# Patient Record
Sex: Male | Born: 1970 | Race: White | Hispanic: No | Marital: Married | Smoking: Former smoker
Health system: Southern US, Community
[De-identification: ages and names within clinical notes are randomized; demographics above are authoritative.]

## PROBLEM LIST (undated history)

## (undated) DIAGNOSIS — T7840XA Allergy, unspecified, initial encounter: Secondary | ICD-10-CM

## (undated) HISTORY — PX: NASAL SEPTUM SURGERY: SHX37

## (undated) HISTORY — PX: WISDOM TOOTH EXTRACTION: SHX21

## (undated) HISTORY — DX: Allergy, unspecified, initial encounter: T78.40XA

---

## 2001-01-22 ENCOUNTER — Emergency Department (HOSPITAL_COMMUNITY): Admission: EM | Admit: 2001-01-22 | Discharge: 2001-01-22 | Payer: Self-pay | Admitting: Emergency Medicine

## 2001-02-01 ENCOUNTER — Encounter: Admission: RE | Admit: 2001-02-01 | Discharge: 2001-02-01 | Payer: Self-pay | Admitting: Family Medicine

## 2001-02-01 ENCOUNTER — Encounter: Payer: Self-pay | Admitting: Family Medicine

## 2017-01-03 ENCOUNTER — Ambulatory Visit (INDEPENDENT_AMBULATORY_CARE_PROVIDER_SITE_OTHER): Payer: 59 | Admitting: Family Medicine

## 2017-01-03 ENCOUNTER — Encounter: Payer: Self-pay | Admitting: Family Medicine

## 2017-01-03 ENCOUNTER — Ambulatory Visit (INDEPENDENT_AMBULATORY_CARE_PROVIDER_SITE_OTHER): Payer: 59

## 2017-01-03 VITALS — BP 136/96 | HR 101 | Temp 98.1°F | Ht 72.0 in | Wt 213.0 lb

## 2017-01-03 DIAGNOSIS — M79641 Pain in right hand: Secondary | ICD-10-CM

## 2017-01-03 DIAGNOSIS — Z0000 Encounter for general adult medical examination without abnormal findings: Secondary | ICD-10-CM

## 2017-01-03 DIAGNOSIS — W57XXXS Bitten or stung by nonvenomous insect and other nonvenomous arthropods, sequela: Secondary | ICD-10-CM | POA: Diagnosis not present

## 2017-01-03 LAB — URINALYSIS
Bilirubin, UA: NEGATIVE
Glucose, UA: NEGATIVE
Leukocytes, UA: NEGATIVE
Nitrite, UA: NEGATIVE
Protein, UA: NEGATIVE
Specific Gravity, UA: 1.025 (ref 1.005–1.030)
Urobilinogen, Ur: 0.2 mg/dL (ref 0.2–1.0)
pH, UA: 5.5 (ref 5.0–7.5)

## 2017-01-03 NOTE — Patient Instructions (Addendum)
DASH Eating Plan DASH stands for "Dietary Approaches to Stop Hypertension." The DASH eating plan is a healthy eating plan that has been shown to reduce high blood pressure (hypertension). It may also reduce your risk for type 2 diabetes, heart disease, and stroke. The DASH eating plan may also help with weight loss. What are tips for following this plan? General guidelines  Avoid eating more than 2,300 mg (milligrams) of salt (sodium) a day. If you have hypertension, you may need to reduce your sodium intake to 1,500 mg a day.  Limit alcohol intake to no more than 1 drink a day for nonpregnant women and 2 drinks a day for men. One drink equals 12 oz of beer, 5 oz of wine, or 1 oz of hard liquor.  Work with your health care provider to maintain a healthy body weight or to lose weight. Ask what an ideal weight is for you.  Get at least 30 minutes of exercise that causes your heart to beat faster (aerobic exercise) most days of the week. Activities may include walking, swimming, or biking.  Work with your health care provider or diet and nutrition specialist (dietitian) to adjust your eating plan to your individual calorie needs. Reading food labels  Check food labels for the amount of sodium per serving. Choose foods with less than 5 percent of the Daily Value of sodium. Generally, foods with less than 300 mg of sodium per serving fit into this eating plan.  To find whole grains, look for the word "whole" as the first word in the ingredient list. Shopping  Buy products labeled as "low-sodium" or "no salt added."  Buy fresh foods. Avoid canned foods and premade or frozen meals. Cooking  Avoid adding salt when cooking. Use salt-free seasonings or herbs instead of table salt or sea salt. Check with your health care provider or pharmacist before using salt substitutes.  Do not fry foods. Cook foods using healthy methods such as baking, boiling, grilling, and broiling instead.  Cook with  heart-healthy oils, such as olive, canola, soybean, or sunflower oil. Meal planning   Eat a balanced diet that includes: ? 5 or more servings of fruits and vegetables each day. At each meal, try to fill half of your plate with fruits and vegetables. ? Up to 6-8 servings of whole grains each day. ? Less than 6 oz of lean meat, poultry, or fish each day. A 3-oz serving of meat is about the same size as a deck of cards. One egg equals 1 oz. ? 2 servings of low-fat dairy each day. ? A serving of nuts, seeds, or beans 5 times each week. ? Heart-healthy fats. Healthy fats called Omega-3 fatty acids are found in foods such as flaxseeds and coldwater fish, like sardines, salmon, and mackerel.  Limit how much you eat of the following: ? Canned or prepackaged foods. ? Food that is high in trans fat, such as fried foods. ? Food that is high in saturated fat, such as fatty meat. ? Sweets, desserts, sugary drinks, and other foods with added sugar. ? Full-fat dairy products.  Do not salt foods before eating.  Try to eat at least 2 vegetarian meals each week.  Eat more home-cooked food and less restaurant, buffet, and fast food.  When eating at a restaurant, ask that your food be prepared with less salt or no salt, if possible. What foods are recommended? The items listed may not be a complete list. Talk with your dietitian about what   dietary choices are best for you. Grains Whole-grain or whole-wheat bread. Whole-grain or whole-wheat pasta. Brown rice. Oatmeal. Quinoa. Bulgur. Whole-grain and low-sodium cereals. Pita bread. Low-fat, low-sodium crackers. Whole-wheat flour tortillas. Vegetables Fresh or frozen vegetables (raw, steamed, roasted, or grilled). Low-sodium or reduced-sodium tomato and vegetable juice. Low-sodium or reduced-sodium tomato sauce and tomato paste. Low-sodium or reduced-sodium canned vegetables. Fruits All fresh, dried, or frozen fruit. Canned fruit in natural juice (without  added sugar). Meat and other protein foods Skinless chicken or turkey. Ground chicken or turkey. Pork with fat trimmed off. Fish and seafood. Egg whites. Dried beans, peas, or lentils. Unsalted nuts, nut butters, and seeds. Unsalted canned beans. Lean cuts of beef with fat trimmed off. Low-sodium, lean deli meat. Dairy Low-fat (1%) or fat-free (skim) milk. Fat-free, low-fat, or reduced-fat cheeses. Nonfat, low-sodium ricotta or cottage cheese. Low-fat or nonfat yogurt. Low-fat, low-sodium cheese. Fats and oils Soft margarine without trans fats. Vegetable oil. Low-fat, reduced-fat, or light mayonnaise and salad dressings (reduced-sodium). Canola, safflower, olive, soybean, and sunflower oils. Avocado. Seasoning and other foods Herbs. Spices. Seasoning mixes without salt. Unsalted popcorn and pretzels. Fat-free sweets. What foods are not recommended? The items listed may not be a complete list. Talk with your dietitian about what dietary choices are best for you. Grains Baked goods made with fat, such as croissants, muffins, or some breads. Dry pasta or rice meal packs. Vegetables Creamed or fried vegetables. Vegetables in a cheese sauce. Regular canned vegetables (not low-sodium or reduced-sodium). Regular canned tomato sauce and paste (not low-sodium or reduced-sodium). Regular tomato and vegetable juice (not low-sodium or reduced-sodium). Pickles. Olives. Fruits Canned fruit in a light or heavy syrup. Fried fruit. Fruit in cream or butter sauce. Meat and other protein foods Fatty cuts of meat. Ribs. Fried meat. Bacon. Sausage. Bologna and other processed lunch meats. Salami. Fatback. Hotdogs. Bratwurst. Salted nuts and seeds. Canned beans with added salt. Canned or smoked fish. Whole eggs or egg yolks. Chicken or turkey with skin. Dairy Whole or 2% milk, cream, and half-and-half. Whole or full-fat cream cheese. Whole-fat or sweetened yogurt. Full-fat cheese. Nondairy creamers. Whipped toppings.  Processed cheese and cheese spreads. Fats and oils Butter. Stick margarine. Lard. Shortening. Ghee. Bacon fat. Tropical oils, such as coconut, palm kernel, or palm oil. Seasoning and other foods Salted popcorn and pretzels. Onion salt, garlic salt, seasoned salt, table salt, and sea salt. Worcestershire sauce. Tartar sauce. Barbecue sauce. Teriyaki sauce. Soy sauce, including reduced-sodium. Steak sauce. Canned and packaged gravies. Fish sauce. Oyster sauce. Cocktail sauce. Horseradish that you find on the shelf. Ketchup. Mustard. Meat flavorings and tenderizers. Bouillon cubes. Hot sauce and Tabasco sauce. Premade or packaged marinades. Premade or packaged taco seasonings. Relishes. Regular salad dressings. Where to find more information:  National Heart, Lung, and Blood Institute: www.nhlbi.nih.gov  American Heart Association: www.heart.org Summary  The DASH eating plan is a healthy eating plan that has been shown to reduce high blood pressure (hypertension). It may also reduce your risk for type 2 diabetes, heart disease, and stroke.  With the DASH eating plan, you should limit salt (sodium) intake to 2,300 mg a day. If you have hypertension, you may need to reduce your sodium intake to 1,500 mg a day.  When on the DASH eating plan, aim to eat more fresh fruits and vegetables, whole grains, lean proteins, low-fat dairy, and heart-healthy fats.  Work with your health care provider or diet and nutrition specialist (dietitian) to adjust your eating plan to your individual   calorie needs. This information is not intended to replace advice given to you by your health care provider. Make sure you discuss any questions you have with your health care provider. Document Released: 09/14/2011 Document Revised: 09/18/2016 Document Reviewed: 09/18/2016 Elsevier Interactive Patient Education  2017 Elsevier Inc.  

## 2017-01-03 NOTE — Progress Notes (Addendum)
Subjective:  Patient ID: Joseph Spears, male    DOB: 09/04/1971  Age: 46 y.o. MRN: [id number]  CC: New Patient (Initial Visit) (pt here today to establish care and is c/o right hand swelling and pain. He hasn't had any recent injuries.)   HPI Joseph Spears presents for 3 days of increasing swelling and soreness in the right hand. He has no known injury. No overuse no suspicious event. He's not been bitten this far as he knows. No sharp momentary pains noted. It just seemed to develop through the day 3 days ago and increase slowly since that time. Of note is that he had a tick bite last fall. She has not seen this resolved just yet and that there still is a small crust that's irritated at the lower left abdomen near the waist band. He describes it as a small deer tick.  Patient has overall excellent health regimen. He eats carefully and works out regularly. However he's been off of his plan for about 6 months and is put on about 20 pounds. 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   History Joseph Spears has a past medical history of Allergy.   He has a past surgical history that includes Wisdom tooth extraction and Nasal septum surgery.   His family history includes Arthritis in his mother; Diabetes in his father; Hyperlipidemia in his mother; Hypertension in his mother.He reports that he quit smoking about 19 years ago. His smoking use included Cigarettes. He smoked 1.50 packs per day. He has never used smokeless tobacco. He reports that he does not drink alcohol or use drugs.    ROS Review of Systems  Constitutional: Negative for chills, diaphoresis, fever and unexpected weight change.  HENT: Negative for congestion, hearing loss, rhinorrhea and sore throat.   Eyes: Negative for visual disturbance.  Respiratory: Negative for cough and shortness of breath.   Cardiovascular: Negative for chest pain.  Gastrointestinal: Negative for abdominal pain,  constipation and diarrhea.  Genitourinary: Negative for dysuria and flank pain.  Musculoskeletal: Positive for joint swelling. Negative for arthralgias.  Skin: Negative for rash.  Neurological: Negative for dizziness and headaches.  Psychiatric/Behavioral: Negative for dysphoric mood and sleep disturbance.    Objective:  BP (!) 136/96   Pulse (!) 101   Temp 98.1 F (36.7 C) (Oral)   Ht 6' (1.829 m)   Wt 213 lb (96.6 kg)   BMI 28.89 kg/m   BP Readings from Last 3 Encounters:  01/03/17 (!) 136/96    Wt Readings from Last 3 Encounters:  01/03/17 213 lb (96.6 kg)     Physical Exam  Constitutional: He is oriented to person, place, and time. He appears well-developed and well-nourished. No distress.  HENT:  Head: Normocephalic and atraumatic.  Right Ear: External ear normal.  Left Ear: External ear normal.  Nose: Nose normal.  Mouth/Throat: Oropharynx is clear and moist.  Eyes: Conjunctivae and EOM are normal. Pupils are equal, round, and reactive to light.  Neck: Normal range of motion. Neck supple. No tracheal deviation present. No thyromegaly present.  Cardiovascular: Normal rate, regular rhythm and normal heart sounds.  Exam reveals no gallop and no friction rub.   No murmur heard. Pulmonary/Chest: Effort normal and breath sounds normal. No respiratory distress. He has no wheezes. He has no rales.  Abdominal: Soft. Bowel sounds are normal. He exhibits no distension and no mass. There is no tenderness.  Musculoskeletal: Normal range  of motion. He exhibits edema (right hand).  Lymphadenopathy:    He has no cervical adenopathy.  Neurological: He is alert and oriented to person, place, and time. He has normal reflexes.  Skin: Skin is warm and dry.  Psychiatric: He has a normal mood and affect. His behavior is normal. Judgment and thought content normal.      Assessment & Plan:   Joseph Spears was seen today for new patient (initial visit).  Diagnoses and all orders for this  visit:  Well adult exam -     CBC with Differential/Platelet -     CMP14+EGFR -     Lipid panel -     PSA Total (Reflex To Free)  Hand pain, right -     CBC with Differential/Platelet -     CMP14+EGFR -     PSA Total (Reflex To Free) -     Urinalysis -     DG Hand Complete Right; Future -     Lyme Ab/Western Blot Reflex -     Sedimentation rate -     Uric acid  Tick bite, sequela -     CBC with Differential/Platelet -     CMP14+EGFR -     Lyme Ab/Western Blot Reflex       I am having Joseph Spears maintain his Omega-3 Fatty Acids (FISH OIL CONCENTRATE PO) and loratadine.  Allergies as of 01/03/2017   No Known Allergies     Medication List       Accurate as of 01/03/17  3:12 PM. Always use your most recent med list.          FISH OIL CONCENTRATE PO Take by mouth.   loratadine 10 MG tablet Commonly known as:  CLARITIN Take 10 mg by mouth daily.     Alternate ice and heat. Ice primarily after strenuous activity with the hand. Notify me if he takes a turn for the worse in the form of swelling redness pain or pus. No brace should be necessary at this point but moderate activities. Ibuprofen 600 mg 3 times a day OTC should be sufficient for discomfort.  Marginally elevated blood pressure noted today. He states that his weight is up and he's off track with his workout and diet regimen. He is going to resume that. He'll monitor his blood pressure at work and home and notify me if it doesn't come down to 85 or below diastolic.  Follow-up: Return in about 1 year (around 01/03/2018), or if symptoms worsen or fail to improve, for Wellness.  Claretta Fraise, M.D.

## 2017-01-04 LAB — CMP14+EGFR
ALT: 41 IU/L (ref 0–44)
AST: 24 IU/L (ref 0–40)
Albumin/Globulin Ratio: 1.9 (ref 1.2–2.2)
Albumin: 5 g/dL (ref 3.5–5.5)
Alkaline Phosphatase: 71 IU/L (ref 39–117)
BUN/Creatinine Ratio: 20 (ref 9–20)
BUN: 17 mg/dL (ref 6–24)
Bilirubin Total: 0.6 mg/dL (ref 0.0–1.2)
CO2: 25 mmol/L (ref 18–29)
Calcium: 9.8 mg/dL (ref 8.7–10.2)
Chloride: 97 mmol/L (ref 96–106)
Creatinine, Ser: 0.86 mg/dL (ref 0.76–1.27)
GFR calc Af Amer: 119 mL/min/{1.73_m2} (ref >59)
GFR calc non Af Amer: 103 mL/min/{1.73_m2} (ref >59)
Globulin, Total: 2.7 g/dL (ref 1.5–4.5)
Glucose: 83 mg/dL (ref 65–99)
Potassium: 4.1 mmol/L (ref 3.5–5.2)
Sodium: 139 mmol/L (ref 134–144)
Total Protein: 7.7 g/dL (ref 6.0–8.5)

## 2017-01-04 LAB — CBC WITH DIFFERENTIAL/PLATELET
Basophils Absolute: 0 10*3/uL (ref 0.0–0.2)
Basos: 0 %
EOS (ABSOLUTE): 0.1 10*3/uL (ref 0.0–0.4)
Eos: 2 %
Hematocrit: 49.7 % (ref 37.5–51.0)
Hemoglobin: 17 g/dL (ref 13.0–17.7)
Immature Grans (Abs): 0 10*3/uL (ref 0.0–0.1)
Immature Granulocytes: 0 %
Lymphocytes Absolute: 2 10*3/uL (ref 0.7–3.1)
Lymphs: 29 %
MCH: 30.6 pg (ref 26.6–33.0)
MCHC: 34.2 g/dL (ref 31.5–35.7)
MCV: 90 fL (ref 79–97)
Monocytes Absolute: 0.5 10*3/uL (ref 0.1–0.9)
Monocytes: 8 %
Neutrophils Absolute: 4.2 10*3/uL (ref 1.4–7.0)
Neutrophils: 61 %
Platelets: 219 10*3/uL (ref 150–379)
RBC: 5.55 x10E6/uL (ref 4.14–5.80)
RDW: 13.4 % (ref 12.3–15.4)
WBC: 6.9 10*3/uL (ref 3.4–10.8)

## 2017-01-04 LAB — PSA TOTAL (REFLEX TO FREE): Prostate Specific Ag, Serum: 1 ng/mL (ref 0.0–4.0)

## 2017-01-04 LAB — SEDIMENTATION RATE: Sed Rate: 2 mm/hr (ref 0–15)

## 2017-01-04 LAB — LYME AB/WESTERN BLOT REFLEX
LYME DISEASE AB, QUANT, IGM: <0.8 index (ref 0.00–0.79)
Lyme IgG/IgM Ab: <0.91 {ISR} (ref 0.00–0.90)

## 2017-01-04 LAB — LIPID PANEL
Chol/HDL Ratio: 6.1 ratio units — ABNORMAL HIGH (ref 0.0–5.0)
Cholesterol, Total: 237 mg/dL — ABNORMAL HIGH (ref 100–199)
HDL: 39 mg/dL — ABNORMAL LOW (ref >39)
Triglycerides: 472 mg/dL — ABNORMAL HIGH (ref 0–149)

## 2017-01-04 LAB — URIC ACID: Uric Acid: 6.4 mg/dL (ref 3.7–8.6)

## 2017-01-08 ENCOUNTER — Other Ambulatory Visit: Payer: Self-pay | Admitting: Family Medicine

## 2017-01-08 ENCOUNTER — Telehealth: Payer: Self-pay | Admitting: Family Medicine

## 2017-01-08 ENCOUNTER — Other Ambulatory Visit: Payer: Self-pay | Admitting: *Deleted

## 2017-01-08 MED ORDER — PREDNISONE 10 MG PO TABS: ORAL_TABLET | ORAL | 0 refills | Status: DC

## 2017-01-08 MED ORDER — ATORVASTATIN CALCIUM 40 MG PO TABS: 40.0000 mg | ORAL_TABLET | Freq: Every day | ORAL | 1 refills | Status: DC

## 2017-01-08 NOTE — Telephone Encounter (Signed)
Pt aware of lab results States his hand is still swollen after 7 days now Has been icing it as much as possible and taking tylenol as directed. Please advise

## 2017-01-08 NOTE — Telephone Encounter (Signed)
Please contact the patient to tell him that the next step in treatment for this is a prednisone Dosepak. I sent this prescription into [facility].  Thanks, [employer]. (I tried to reach him three times this afternoon with no success.)

## 2017-01-09 ENCOUNTER — Telehealth: Payer: Self-pay | Admitting: Family Medicine

## 2017-01-09 NOTE — Telephone Encounter (Signed)
Patient aware of recommendations.  

## 2017-01-09 NOTE — Telephone Encounter (Signed)
Patient aware that labs have been released to mychart.

## 2017-02-13 MED FILL — ATORVASTATIN 40 MG TABLET: 40 | 90 days supply | Qty: 90 | Fill #0

## 2017-05-28 MED FILL — ATORVASTATIN 40 MG TABLET: 40 | 60 days supply | Qty: 60 | Fill #1

## 2017-08-03 ENCOUNTER — Ambulatory Visit (INDEPENDENT_AMBULATORY_CARE_PROVIDER_SITE_OTHER): Payer: 59 | Admitting: Family Medicine

## 2017-08-03 ENCOUNTER — Encounter: Payer: Self-pay | Admitting: Family Medicine

## 2017-08-03 VITALS — BP 134/87 | HR 86 | Temp 98.6°F | Ht 72.0 in | Wt 221.0 lb

## 2017-08-03 DIAGNOSIS — E782 Mixed hyperlipidemia: Secondary | ICD-10-CM

## 2017-08-03 MED ORDER — ATORVASTATIN CALCIUM 40 MG PO TABS: 40.0000 mg | ORAL_TABLET | Freq: Every day | ORAL | 1 refills | Status: DC

## 2017-08-03 MED FILL — ATORVASTATIN 40 MG TABLET: 40 | 90 days supply | Qty: 90 | Fill #0

## 2017-08-03 NOTE — Patient Instructions (Signed)
Fat and Cholesterol Restricted Diet Getting too much fat and cholesterol in your diet may cause health problems. Following this diet helps keep your fat and cholesterol at normal levels. This can keep you from getting sick. What types of fat should I choose?  Choose monosaturated and polyunsaturated fats. These are found in foods such as olive oil, canola oil, flaxseeds, walnuts, almonds, and seeds.  Eat more omega-3 fats. Good choices include salmon, mackerel, sardines, tuna, flaxseed oil, and ground flaxseeds.  Limit saturated fats. These are in animal products such as meats, butter, and cream. They can also be in plant products such as palm oil, palm kernel oil, and coconut oil.  Avoid foods with partially hydrogenated oils in them. These contain trans fats. Examples of foods that have trans fats are stick margarine, some tub margarines, cookies, crackers, and other baked goods. What general guidelines do I need to follow?  Check food labels. Look for the words "trans fat" and "saturated fat."  When preparing a meal: ? Fill half of your plate with vegetables and green salads. ? Fill one fourth of your plate with whole grains. Look for the word "whole" as the first word in the ingredient list. ? Fill one fourth of your plate with lean protein foods.  Eat more foods that have fiber, like apples, carrots, beans, peas, and barley.  Eat more home-cooked foods. Eat less at restaurants and buffets.  Limit or avoid alcohol.  Limit foods high in starch and sugar.  Limit fried foods.  Cook foods without frying them. Baking, boiling, grilling, and broiling are all great options.  Lose weight if you are overweight. Losing even a small amount of weight can help your overall health. It can also help prevent diseases such as diabetes and heart disease. What foods can I eat? Grains Whole grains, such as whole wheat or whole grain breads, crackers, cereals, and pasta. Unsweetened oatmeal,  bulgur, barley, quinoa, or brown rice. Corn or whole wheat flour tortillas. Vegetables Fresh or frozen vegetables (raw, steamed, roasted, or grilled). Green salads. Fruits All fresh, canned (in natural juice), or frozen fruits. Meat and Other Protein Products Ground beef (85% or leaner), grass-fed beef, or beef trimmed of fat. Skinless chicken or turkey. Ground chicken or turkey. Pork trimmed of fat. All fish and seafood. Eggs. Dried beans, peas, or lentils. Unsalted nuts or seeds. Unsalted canned or dry beans. Dairy Low-fat dairy products, such as skim or 1% milk, 2% or reduced-fat cheeses, low-fat ricotta or cottage cheese, or plain low-fat yogurt. Fats and Oils Tub margarines without trans fats. Light or reduced-fat mayonnaise and salad dressings. Avocado. Olive, canola, sesame, or safflower oils. Natural peanut or almond butter (choose ones without added sugar and oil). The items listed above may not be a complete list of recommended foods or beverages. Contact your dietitian for more options. What foods are not recommended? Grains White bread. White pasta. White rice. Cornbread. Bagels, pastries, and croissants. Crackers that contain trans fat. Vegetables White potatoes. Corn. Creamed or fried vegetables. Vegetables in a cheese sauce. Fruits Dried fruits. Canned fruit in light or heavy syrup. Fruit juice. Meat and Other Protein Products Fatty cuts of meat. Ribs, chicken wings, bacon, sausage, bologna, salami, chitterlings, fatback, hot dogs, bratwurst, and packaged luncheon meats. Liver and organ meats. Dairy Whole or 2% milk, cream, half-and-half, and cream cheese. Whole milk cheeses. Whole-fat or sweetened yogurt. Full-fat cheeses. Nondairy creamers and whipped toppings. Processed cheese, cheese spreads, or cheese curds. Sweets and Desserts Corn   syrup, sugars, honey, and molasses. Candy. Jam and jelly. Syrup. Sweetened cereals. Cookies, pies, cakes, donuts, muffins, and ice  cream. Fats and Oils Butter, stick margarine, lard, shortening, ghee, or bacon fat. Coconut, palm kernel, or palm oils. Beverages Alcohol. Sweetened drinks (such as sodas, lemonade, and fruit drinks or punches). The items listed above may not be a complete list of foods and beverages to avoid. Contact your dietitian for more information. This information is not intended to replace advice given to you by your health care provider. Make sure you discuss any questions you have with your health care provider. Document Released: 03/26/2012 Document Revised: 06/01/2016 Document Reviewed: 12/25/2013 Elsevier Interactive Patient Education  2018 Elsevier Inc.  

## 2017-08-03 NOTE — Progress Notes (Signed)
Subjective:  Patient ID: JP EASTHAM, male    DOB: 1971/03/01  Age: 46 y.o. MRN: [id number]  CC: Hyperlipidemia (pt here today for follow up on his cholesterol, no other concerns voiced.)   HPI ARGENIS KUMARI presents for follow-up of elevated cholesterol. Doing well without complaints on current medication. Denies side effects of statin including myalgia and arthralgia and nausea. Also in today for liver function testing. Currently no chest pain, shortness of breath or other cardiovascular related symptoms noted. History Chee has a past medical history of Allergy.   He has a past surgical history that includes Wisdom tooth extraction and Nasal septum surgery.   His family history includes Arthritis in his mother; Diabetes in his father; Hyperlipidemia in his mother; Hypertension in his mother.He reports that he quit smoking about 19 years ago. His smoking use included Cigarettes. He smoked 1.50 packs per day. He has never used smokeless tobacco. He reports that he does not drink alcohol or use drugs.  Current Outpatient Prescriptions on File Prior to Visit  Medication Sig Dispense Refill  . loratadine (CLARITIN) 10 MG tablet Take 10 mg by mouth daily.    . Omega-3 Fatty Acids (FISH OIL CONCENTRATE PO) Take by mouth.     No current facility-administered medications on file prior to visit.     ROS Review of Systems  Constitutional: Negative for chills, diaphoresis, fever and unexpected weight change.  HENT: Negative for congestion, hearing loss, rhinorrhea and sore throat.   Eyes: Negative for visual disturbance.  Respiratory: Negative for cough and shortness of breath.   Cardiovascular: Negative for chest pain.  Gastrointestinal: Negative for abdominal pain, constipation and diarrhea.  Genitourinary: Negative for dysuria and flank pain.  Musculoskeletal: Negative for arthralgias and joint swelling.  Skin: Negative for rash.  Neurological: Negative for dizziness and headaches.    Psychiatric/Behavioral: Negative for dysphoric mood and sleep disturbance.    Objective:  BP 134/87   Pulse 86   Temp 98.6 F (37 C) (Oral)   Ht 6' (1.829 m)   Wt 221 lb (100.2 kg)   BMI 29.97 kg/m   BP Readings from Last 3 Encounters:  08/03/17 134/87  01/03/17 (!) 136/96    Wt Readings from Last 3 Encounters:  08/03/17 221 lb (100.2 kg)  01/03/17 213 lb (96.6 kg)     Physical Exam  Constitutional: He appears well-developed and well-nourished.  HENT:  Head: Normocephalic and atraumatic.  Right Ear: Tympanic membrane and external ear normal. No decreased hearing is noted.  Left Ear: Tympanic membrane and external ear normal. No decreased hearing is noted.  Mouth/Throat: No oropharyngeal exudate or posterior oropharyngeal erythema.  Eyes: Pupils are equal, round, and reactive to light.  Neck: Normal range of motion. Neck supple.  Cardiovascular: Normal rate and regular rhythm.   No murmur heard. Pulmonary/Chest: Breath sounds normal. No respiratory distress.  Abdominal: Soft. Bowel sounds are normal. He exhibits no mass. There is no tenderness.  Vitals reviewed.      Assessment & Plan:   Kiyoto was seen today for hyperlipidemia.  Diagnoses and all orders for this visit:  Mixed hyperlipidemia -     CBC with Differential/Platelet -     CMP14+EGFR  Other orders -     atorvastatin (LIPITOR) 40 MG tablet; Take 1 tablet (40 mg total) by mouth daily.   I have discontinued Mr. Dehaas predniSONE. I am also having him maintain his Omega-3 Fatty Acids (FISH OIL CONCENTRATE PO), loratadine, and atorvastatin.  Meds ordered this encounter  Medications  . atorvastatin (LIPITOR) 40 MG tablet    Sig: Take 1 tablet (40 mg total) by mouth daily.    Dispense:  90 tablet    Refill:  1   Allergies as of 08/03/2017   No Known Allergies     Medication List       Accurate as of 08/03/17  9:48 AM. Always use your most recent med list.          atorvastatin 40 MG  tablet Commonly known as:  LIPITOR Take 1 tablet (40 mg total) by mouth daily.   FISH OIL CONCENTRATE PO Take by mouth.   loratadine 10 MG tablet Commonly known as:  CLARITIN Take 10 mg by mouth daily.        Follow-up: Return in about 6 months (around 02/01/2018) for Wellness.  Claretta Fraise, M.D.

## 2017-08-04 LAB — CMP14+EGFR
ALBUMIN: 5 g/dL (ref 3.5–5.5)
ALT: 45 IU/L — ABNORMAL HIGH (ref 0–44)
AST: 31 IU/L (ref 0–40)
Albumin/Globulin Ratio: 2.1 (ref 1.2–2.2)
Alkaline Phosphatase: 83 IU/L (ref 39–117)
BILIRUBIN TOTAL: 0.7 mg/dL (ref 0.0–1.2)
BUN / CREAT RATIO: 17 (ref 9–20)
BUN: 16 mg/dL (ref 6–24)
CALCIUM: 9.4 mg/dL (ref 8.7–10.2)
CO2: 21 mmol/L (ref 20–29)
Chloride: 102 mmol/L (ref 96–106)
Creatinine, Ser: 0.93 mg/dL (ref 0.76–1.27)
GFR, EST AFRICAN AMERICAN: 112 mL/min/{1.73_m2} (ref >59)
GFR, EST NON AFRICAN AMERICAN: 97 mL/min/{1.73_m2} (ref >59)
Globulin, Total: 2.4 g/dL (ref 1.5–4.5)
Glucose: 83 mg/dL (ref 65–99)
POTASSIUM: 4.7 mmol/L (ref 3.5–5.2)
Sodium: 141 mmol/L (ref 134–144)
TOTAL PROTEIN: 7.4 g/dL (ref 6.0–8.5)

## 2017-08-04 LAB — CBC WITH DIFFERENTIAL/PLATELET
BASOS: 0 %
Basophils Absolute: 0 10*3/uL (ref 0.0–0.2)
EOS (ABSOLUTE): 0.1 10*3/uL (ref 0.0–0.4)
EOS: 1 %
HEMATOCRIT: 47.4 % (ref 37.5–51.0)
HEMOGLOBIN: 16.5 g/dL (ref 13.0–17.7)
IMMATURE GRANS (ABS): 0 10*3/uL (ref 0.0–0.1)
Immature Granulocytes: 0 %
LYMPHS: 29 %
Lymphocytes Absolute: 1.6 10*3/uL (ref 0.7–3.1)
MCH: 30.7 pg (ref 26.6–33.0)
MCHC: 34.8 g/dL (ref 31.5–35.7)
MCV: 88 fL (ref 79–97)
MONOCYTES: 6 %
Monocytes Absolute: 0.4 10*3/uL (ref 0.1–0.9)
NEUTROS ABS: 3.5 10*3/uL (ref 1.4–7.0)
NEUTROS PCT: 64 %
PLATELETS: 186 10*3/uL (ref 150–379)
RBC: 5.37 x10E6/uL (ref 4.14–5.80)
RDW: 13.6 % (ref 12.3–15.4)
WBC: 5.6 10*3/uL (ref 3.4–10.8)

## 2017-11-01 MED FILL — ATORVASTATIN 40 MG TABLET: 40 | 90 days supply | Qty: 90 | Fill #1

## 2017-11-15 IMAGING — DX DG HAND COMPLETE 3+V*R*
4 series · 4 of 4 positions shown · non-contrast
Comparison: No recent prior.

CLINICAL DATA: Pain and swelling.

EXAM:
RIGHT HAND - COMPLETE 3+ VIEW

[hand pa]
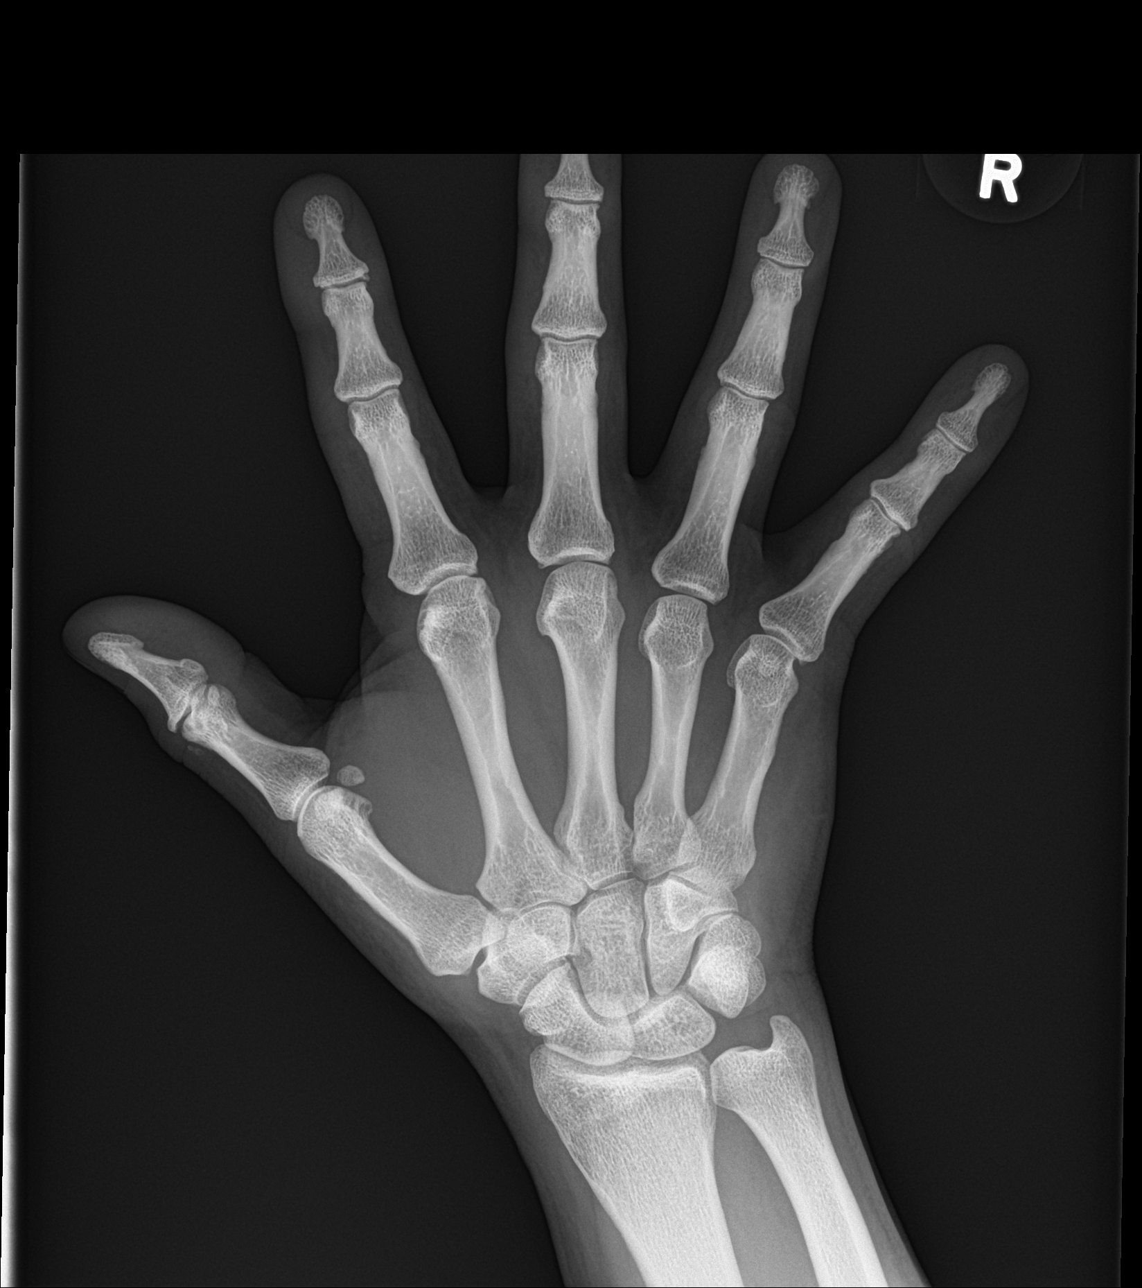

[hand obl]
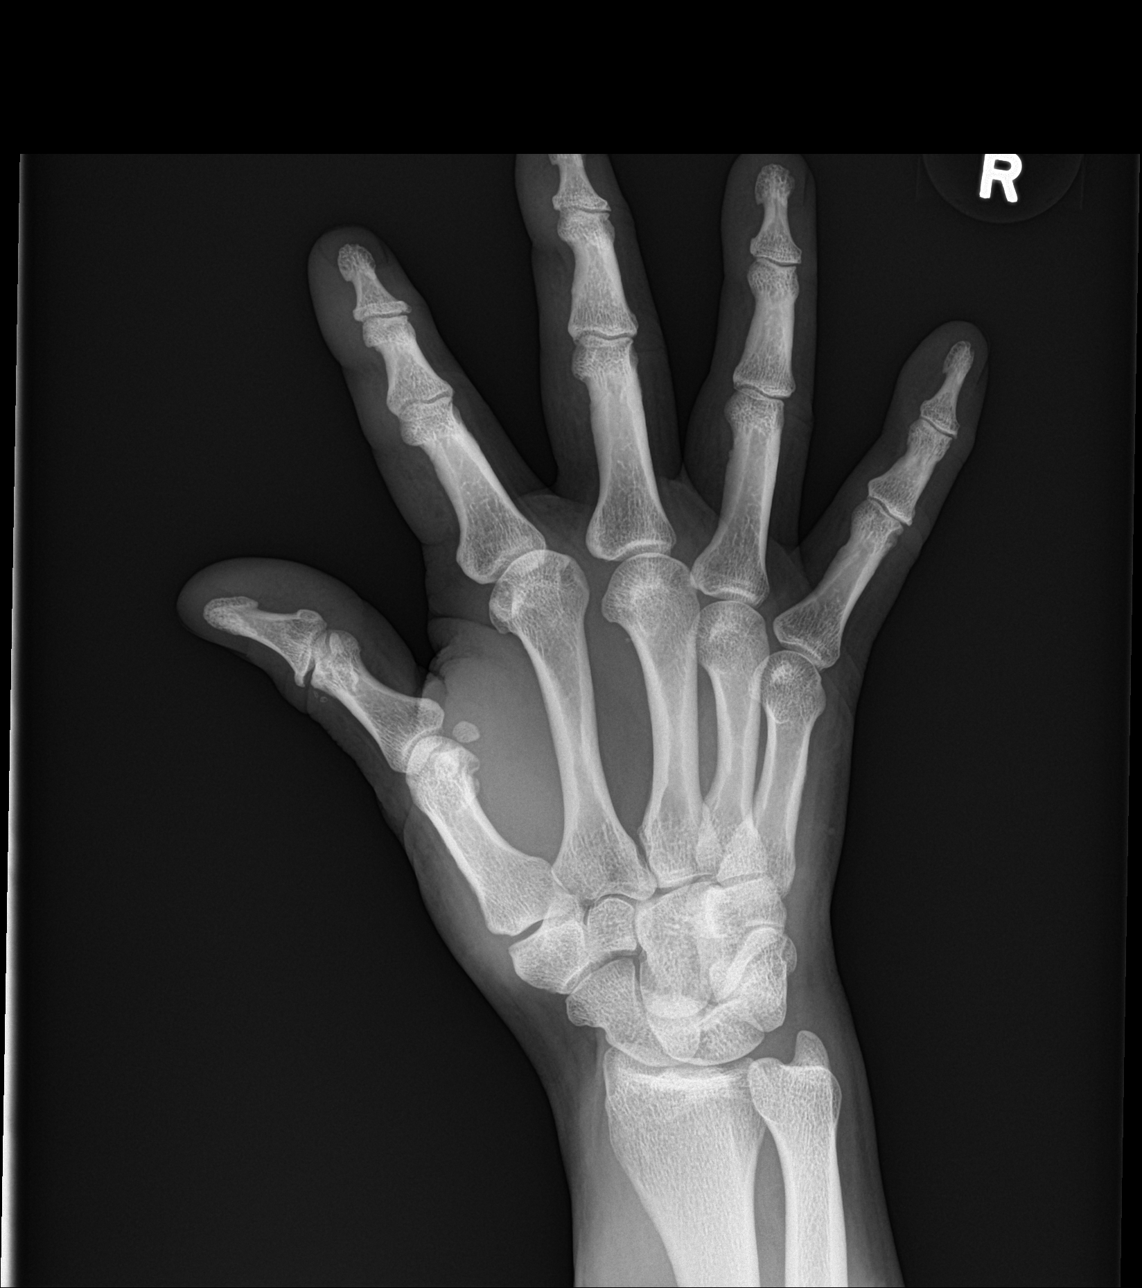

[hand lat (1 of 2)]
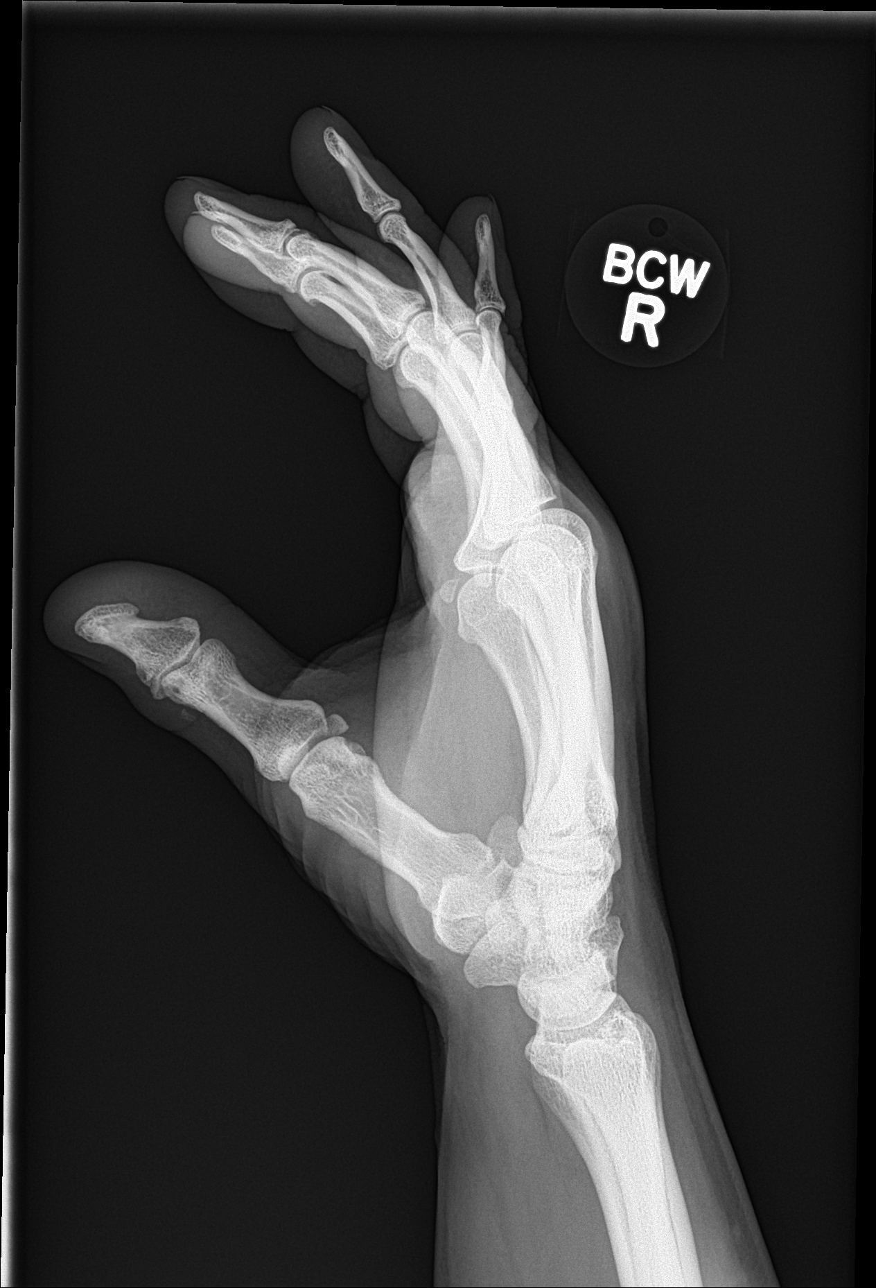

[hand lat (2 of 2)]
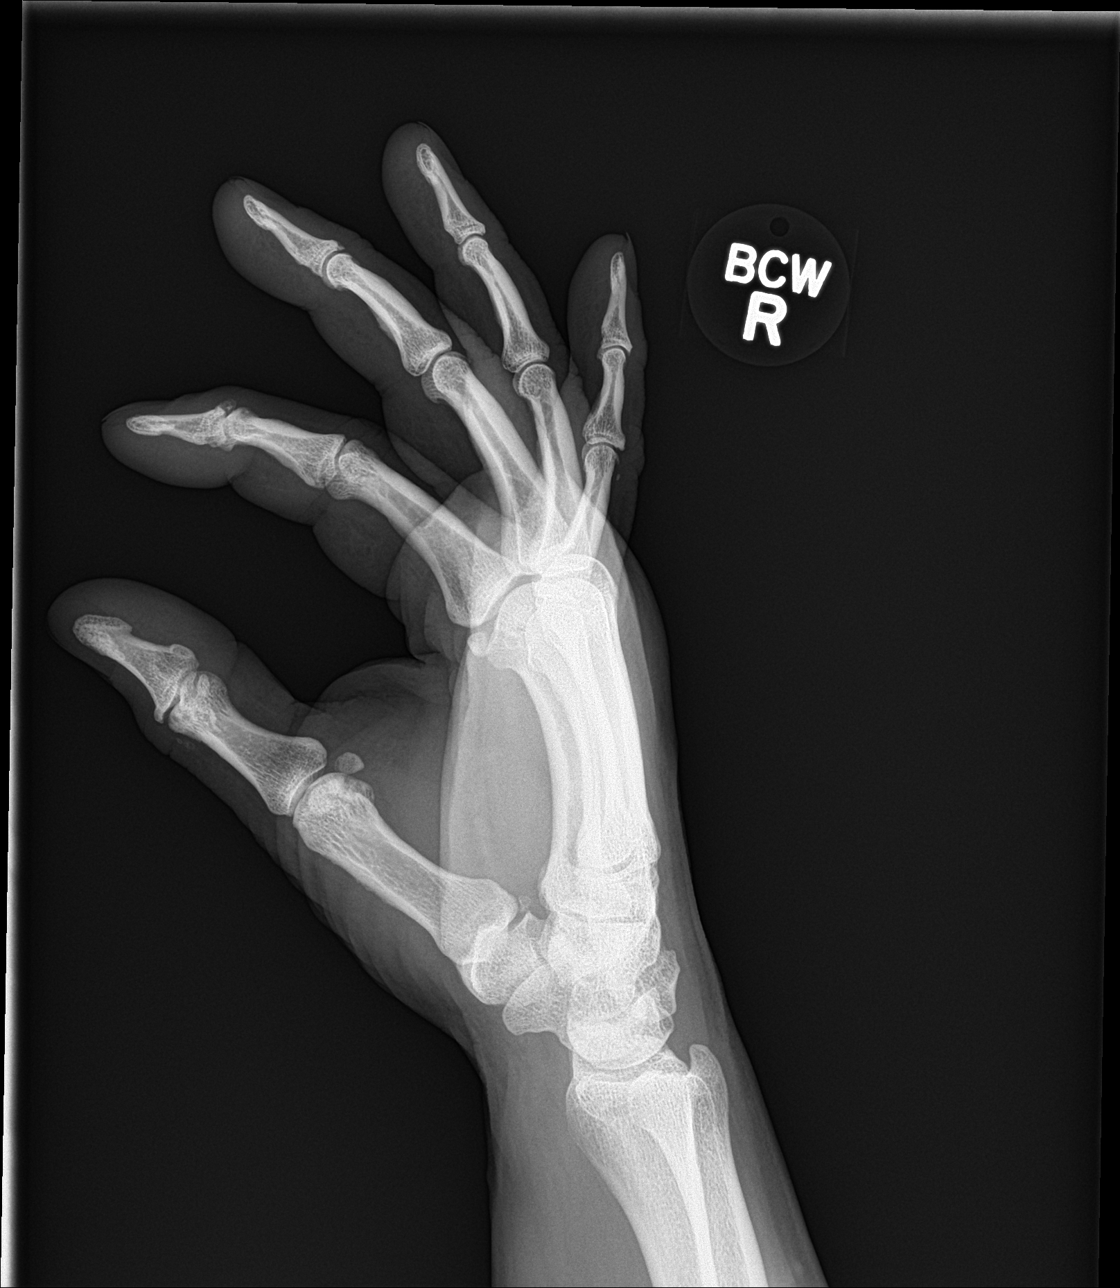

[4 of 4 positions shown; findings below may reference images not displayed]

FINDINGS: No acute bony or joint abnormality identified. No evidence of
fracture or dislocation.
IMPRESSION: No acute abnormality.

## 2018-02-05 ENCOUNTER — Encounter: Payer: 59 | Admitting: Family Medicine

## 2018-02-06 ENCOUNTER — Ambulatory Visit (INDEPENDENT_AMBULATORY_CARE_PROVIDER_SITE_OTHER): Payer: 59 | Admitting: Family Medicine

## 2018-02-06 ENCOUNTER — Encounter: Payer: Self-pay | Admitting: Family Medicine

## 2018-02-06 VITALS — BP 116/81 | HR 84 | Temp 98.1°F | Ht 72.0 in | Wt 209.4 lb

## 2018-02-06 DIAGNOSIS — E782 Mixed hyperlipidemia: Secondary | ICD-10-CM | POA: Diagnosis not present

## 2018-02-06 DIAGNOSIS — Z0000 Encounter for general adult medical examination without abnormal findings: Secondary | ICD-10-CM

## 2018-02-06 LAB — URINALYSIS
Bilirubin, UA: NEGATIVE
GLUCOSE, UA: NEGATIVE
Ketones, UA: NEGATIVE
LEUKOCYTES UA: NEGATIVE
Nitrite, UA: NEGATIVE
PROTEIN UA: NEGATIVE
Specific Gravity, UA: 1.02 (ref 1.005–1.030)
Urobilinogen, Ur: 0.2 mg/dL (ref 0.2–1.0)
pH, UA: 5.5 (ref 5.0–7.5)

## 2018-02-06 MED ORDER — ATORVASTATIN CALCIUM 40 MG PO TABS: 40.0000 mg | ORAL_TABLET | Freq: Every day | ORAL | 1 refills | Status: DC

## 2018-02-06 MED FILL — ATORVASTATIN 40 MG TABLET: 40 | 90 days supply | Qty: 90 | Fill #0

## 2018-02-06 NOTE — Progress Notes (Signed)
 Subjective:  Patient ID: Joseph Spears, male    DOB: 11/10/1970  Age: 47 y.o. MRN: [id number]  CC: Annual Exam (pt here today for CPE)   HPI Joseph Spears presents for annual physical examination.  He has been somewhat irregular in taking the atorvastatin.  Depression screen PHQ 2/9 02/06/2018 08/03/2017 01/03/2017  Decreased Interest 0 0 0  Down, Depressed, Hopeless 0 0 0  PHQ - 2 Score 0 0 0    History Yadir has a past medical history of Allergy.   He has a past surgical history that includes Wisdom tooth extraction and Nasal septum surgery.   His family history includes Arthritis in his mother; Diabetes in his father; Hyperlipidemia in his mother; Hypertension in his mother.He reports that he quit smoking about 20 years ago. His smoking use included cigarettes. He smoked 1.50 packs per day. He has never used smokeless tobacco. He reports that he does not drink alcohol or use drugs.    ROS Review of Systems  Constitutional: Negative.   HENT: Negative.   Eyes: Negative for visual disturbance.  Respiratory: Negative for cough and shortness of breath.   Cardiovascular: Negative for chest pain and leg swelling.  Gastrointestinal: Negative for abdominal pain, diarrhea, nausea and vomiting.  Genitourinary: Negative for difficulty urinating.  Musculoskeletal: Negative for arthralgias and myalgias.  Skin: Negative for rash.  Neurological: Negative for headaches.  Psychiatric/Behavioral: Negative for sleep disturbance.    Objective:  BP 116/81   Pulse 84   Temp 98.1 F (36.7 C) (Oral)   Ht 6' (1.829 m)   Wt 209 lb 6 oz (95 kg)   BMI 28.40 kg/m   BP Readings from Last 3 Encounters:  02/06/18 116/81  08/03/17 134/87  01/03/17 (!) 136/96    Wt Readings from Last 3 Encounters:  02/06/18 209 lb 6 oz (95 kg)  08/03/17 221 lb (100.2 kg)  01/03/17 213 lb (96.6 kg)     Physical Exam  Constitutional: He is oriented to person, place, and time. No distress.  HENT:    Head: Normocephalic and atraumatic.  Right Ear: External ear normal.  Left Ear: External ear normal.  Nose: Nose normal.  Mouth/Throat: Oropharynx is clear and moist.  Eyes: Pupils are equal, round, and reactive to light. Conjunctivae and EOM are normal. No scleral icterus.  Neck: Normal range of motion. Neck supple. No JVD present. No tracheal deviation present. No thyromegaly present.  Cardiovascular: Normal rate, regular rhythm and normal heart sounds. Exam reveals no gallop and no friction rub.  No murmur heard. Pulmonary/Chest: Effort normal and breath sounds normal. No respiratory distress. He has no wheezes. He has no rales. He exhibits no tenderness.  Abdominal: Soft. Bowel sounds are normal. He exhibits no distension and no mass. There is no tenderness. There is no rebound and no guarding.  Musculoskeletal: Normal range of motion. He exhibits no edema or tenderness.  Lymphadenopathy:    He has no cervical adenopathy.  Neurological: He is alert and oriented to person, place, and time. He displays normal reflexes. No cranial nerve deficit. He exhibits normal muscle tone. Coordination normal.  Skin: Skin is warm and dry. No rash noted. He is not diaphoretic. No erythema.  Psychiatric: Judgment normal.  Vitals reviewed.     Assessment & Plan:   Jamaul was seen today for annual exam.  Diagnoses and all orders for this visit:  Well adult exam -     CBC with Differential/Platelet -       CMP14+EGFR -     Lipid panel -     PSA, total and free -     VITAMIN D 25 Hydroxy (Vit-D Deficiency, Fractures) -     Urinalysis  Mixed hyperlipidemia -     Lipid panel  Other orders -     atorvastatin (LIPITOR) 40 MG tablet; Take 1 tablet (40 mg total) by mouth daily.       I am having Ahad T. Ballen maintain his Omega-3 Fatty Acids (FISH OIL CONCENTRATE PO), loratadine, and atorvastatin.  Allergies as of 02/06/2018   No Known Allergies     Medication List        Accurate as  of 02/06/18 11:59 AM. Always use your most recent med list.          atorvastatin 40 MG tablet Commonly known as:  LIPITOR Take 1 tablet (40 mg total) by mouth daily.   FISH OIL CONCENTRATE PO Take by mouth.   loratadine 10 MG tablet Commonly known as:  CLARITIN Take 10 mg by mouth daily.      For borderline blood pressure he will a low-sodium diet.DASH Program printed for him.  Follow-up: Return in about 6 months (around 08/09/2018).  Warren Stacks, M.D. 

## 2018-02-06 NOTE — Patient Instructions (Signed)
Fat and Cholesterol Restricted Diet Getting too much fat and cholesterol in your diet may cause health problems. Following this diet helps keep your fat and cholesterol at normal levels. This can keep you from getting sick. What types of fat should I choose?  Choose monosaturated and polyunsaturated fats. These are found in foods such as olive oil, canola oil, flaxseeds, walnuts, almonds, and seeds.  Eat more omega-3 fats. Good choices include salmon, mackerel, sardines, tuna, flaxseed oil, and ground flaxseeds.  Limit saturated fats. These are in animal products such as meats, butter, and cream. They can also be in plant products such as palm oil, palm kernel oil, and coconut oil.  Avoid foods with partially hydrogenated oils in them. These contain trans fats. Examples of foods that have trans fats are stick margarine, some tub margarines, cookies, crackers, and other baked goods. What general guidelines do I need to follow?  Check food labels. Look for the words "trans fat" and "saturated fat."  When preparing a meal: ? Fill half of your plate with vegetables and green salads. ? Fill one fourth of your plate with whole grains. Look for the word "whole" as the first word in the ingredient list. ? Fill one fourth of your plate with lean protein foods.  Eat more foods that have fiber, like apples, carrots, beans, peas, and barley.  Eat more home-cooked foods. Eat less at restaurants and buffets.  Limit or avoid alcohol.  Limit foods high in starch and sugar.  Limit fried foods.  Cook foods without frying them. Baking, boiling, grilling, and broiling are all great options.  Lose weight if you are overweight. Losing even a small amount of weight can help your overall health. It can also help prevent diseases such as diabetes and heart disease. What foods can I eat? Grains Whole grains, such as whole wheat or whole grain breads, crackers, cereals, and pasta. Unsweetened oatmeal,  bulgur, barley, quinoa, or brown rice. Corn or whole wheat flour tortillas. Vegetables Fresh or frozen vegetables (raw, steamed, roasted, or grilled). Green salads. Fruits All fresh, canned (in natural juice), or frozen fruits. Meat and Other Protein Products Ground beef (85% or leaner), grass-fed beef, or beef trimmed of fat. Skinless chicken or turkey. Ground chicken or turkey. Pork trimmed of fat. All fish and seafood. Eggs. Dried beans, peas, or lentils. Unsalted nuts or seeds. Unsalted canned or dry beans. Dairy Low-fat dairy products, such as skim or 1% milk, 2% or reduced-fat cheeses, low-fat ricotta or cottage cheese, or plain low-fat yogurt. Fats and Oils Tub margarines without trans fats. Light or reduced-fat mayonnaise and salad dressings. Avocado. Olive, canola, sesame, or safflower oils. Natural peanut or almond butter (choose ones without added sugar and oil). The items listed above may not be a complete list of recommended foods or beverages. Contact your dietitian for more options. What foods are not recommended? Grains White bread. White pasta. White rice. Cornbread. Bagels, pastries, and croissants. Crackers that contain trans fat. Vegetables White potatoes. Corn. Creamed or fried vegetables. Vegetables in a cheese sauce. Fruits Dried fruits. Canned fruit in light or heavy syrup. Fruit juice. Meat and Other Protein Products Fatty cuts of meat. Ribs, chicken wings, bacon, sausage, bologna, salami, chitterlings, fatback, hot dogs, bratwurst, and packaged luncheon meats. Liver and organ meats. Dairy Whole or 2% milk, cream, half-and-half, and cream cheese. Whole milk cheeses. Whole-fat or sweetened yogurt. Full-fat cheeses. Nondairy creamers and whipped toppings. Processed cheese, cheese spreads, or cheese curds. Sweets and Desserts Corn   syrup, sugars, honey, and molasses. Candy. Jam and jelly. Syrup. Sweetened cereals. Cookies, pies, cakes, donuts, muffins, and ice  cream. Fats and Oils Butter, stick margarine, lard, shortening, ghee, or bacon fat. Coconut, palm kernel, or palm oils. Beverages Alcohol. Sweetened drinks (such as sodas, lemonade, and fruit drinks or punches). The items listed above may not be a complete list of foods and beverages to avoid. Contact your dietitian for more information. This information is not intended to replace advice given to you by your health care provider. Make sure you discuss any questions you have with your health care provider. Document Released: 03/26/2012 Document Revised: 06/01/2016 Document Reviewed: 12/25/2013 Elsevier Interactive Patient Education  2018 Elsevier Inc.  

## 2018-02-07 LAB — CBC WITH DIFFERENTIAL/PLATELET
BASOS ABS: 0 10*3/uL (ref 0.0–0.2)
BASOS: 0 %
EOS (ABSOLUTE): 0 10*3/uL (ref 0.0–0.4)
Eos: 1 %
Hematocrit: 46.3 % (ref 37.5–51.0)
Hemoglobin: 16.2 g/dL (ref 13.0–17.7)
IMMATURE GRANULOCYTES: 0 %
Immature Grans (Abs): 0 10*3/uL (ref 0.0–0.1)
LYMPHS: 25 %
Lymphocytes Absolute: 1.4 10*3/uL (ref 0.7–3.1)
MCH: 31 pg (ref 26.6–33.0)
MCHC: 35 g/dL (ref 31.5–35.7)
MCV: 89 fL (ref 79–97)
MONOS ABS: 0.4 10*3/uL (ref 0.1–0.9)
Monocytes: 7 %
NEUTROS PCT: 67 %
Neutrophils Absolute: 3.7 10*3/uL (ref 1.4–7.0)
PLATELETS: 226 10*3/uL (ref 150–379)
RBC: 5.23 x10E6/uL (ref 4.14–5.80)
RDW: 13.2 % (ref 12.3–15.4)
WBC: 5.5 10*3/uL (ref 3.4–10.8)

## 2018-02-07 LAB — CMP14+EGFR
A/G RATIO: 2.1 (ref 1.2–2.2)
ALT: 45 IU/L — AB (ref 0–44)
AST: 29 IU/L (ref 0–40)
Albumin: 5 g/dL (ref 3.5–5.5)
Alkaline Phosphatase: 76 IU/L (ref 39–117)
BUN/Creatinine Ratio: 16 (ref 9–20)
BUN: 15 mg/dL (ref 6–24)
Bilirubin Total: 1 mg/dL (ref 0.0–1.2)
CALCIUM: 9.9 mg/dL (ref 8.7–10.2)
CHLORIDE: 101 mmol/L (ref 96–106)
CO2: 21 mmol/L (ref 20–29)
Creatinine, Ser: 0.94 mg/dL (ref 0.76–1.27)
GFR calc Af Amer: 110 mL/min/{1.73_m2} (ref >59)
GFR, EST NON AFRICAN AMERICAN: 95 mL/min/{1.73_m2} (ref >59)
GLUCOSE: 98 mg/dL (ref 65–99)
Globulin, Total: 2.4 g/dL (ref 1.5–4.5)
POTASSIUM: 4.5 mmol/L (ref 3.5–5.2)
Sodium: 137 mmol/L (ref 134–144)
Total Protein: 7.4 g/dL (ref 6.0–8.5)

## 2018-02-07 LAB — LIPID PANEL
CHOL/HDL RATIO: 2.7 ratio (ref 0.0–5.0)
Cholesterol, Total: 106 mg/dL (ref 100–199)
HDL: 40 mg/dL (ref >39)
LDL Calculated: 47 mg/dL (ref 0–99)
TRIGLYCERIDES: 95 mg/dL (ref 0–149)
VLDL Cholesterol Cal: 19 mg/dL (ref 5–40)

## 2018-02-07 LAB — PSA, TOTAL AND FREE
PSA, Free Pct: 45.7 %
PSA, Free: 0.32 ng/mL
Prostate Specific Ag, Serum: 0.7 ng/mL (ref 0.0–4.0)

## 2018-02-07 LAB — VITAMIN D 25 HYDROXY (VIT D DEFICIENCY, FRACTURES): Vit D, 25-Hydroxy: 31.9 ng/mL (ref 30.0–100.0)

## 2018-05-06 MED FILL — ATORVASTATIN 40 MG TABLET: 40 | 90 days supply | Qty: 90 | Fill #1

## 2018-08-09 ENCOUNTER — Ambulatory Visit: Payer: 59 | Admitting: Family Medicine

## 2018-09-09 ENCOUNTER — Ambulatory Visit (INDEPENDENT_AMBULATORY_CARE_PROVIDER_SITE_OTHER): Payer: 59 | Admitting: Family Medicine

## 2018-09-09 ENCOUNTER — Encounter: Payer: Self-pay | Admitting: Family Medicine

## 2018-09-09 VITALS — BP 133/75 | HR 83 | Temp 98.7°F | Ht 72.0 in | Wt 210.0 lb

## 2018-09-09 DIAGNOSIS — E782 Mixed hyperlipidemia: Secondary | ICD-10-CM | POA: Diagnosis not present

## 2018-09-09 NOTE — Progress Notes (Signed)
Subjective:  Patient ID: Joseph Spears, male    DOB: May 12, 1971  Age: 47 y.o. MRN: [id number]  CC: Hyperlipidemia (6 mo)   HPI SANTE BIEDERMANN presents for follow-up of elevated cholesterol. Doing well without complaints on current medication. Denies side effects of statin including myalgia and arthralgia and nausea. Also in today for liver function testing. Currently no chest pain, shortness of breath or other cardiovascular related symptoms noted. History Tiler has a past medical history of Allergy.   He has a past surgical history that includes Wisdom tooth extraction and Nasal septum surgery.   His family history includes Arthritis in his mother; Diabetes in his father; Hyperlipidemia in his mother; Hypertension in his mother.He reports that he quit smoking about 20 years ago. His smoking use included cigarettes. He smoked 1.50 packs per day. He has never used smokeless tobacco. He reports that he does not drink alcohol or use drugs.  Current Outpatient Medications on File Prior to Visit  Medication Sig Dispense Refill  . loratadine (CLARITIN) 10 MG tablet Take 10 mg by mouth daily.    . Omega-3 Fatty Acids (FISH OIL CONCENTRATE PO) Take by mouth.     No current facility-administered medications on file prior to visit.     ROS Review of Systems  Constitutional: Negative for fever.  Respiratory: Negative for shortness of breath.   Cardiovascular: Negative for chest pain.  Musculoskeletal: Negative for arthralgias.  Skin: Negative for rash.    Objective:  BP 133/75 (BP Location: Right Arm)   Pulse 83   Temp 98.7 F (37.1 C) (Oral)   Ht 6' (1.829 m)   Wt 210 lb (95.3 kg)   BMI 28.48 kg/m   BP Readings from Last 3 Encounters:  09/09/18 133/75  02/06/18 116/81  08/03/17 134/87    Wt Readings from Last 3 Encounters:  09/09/18 210 lb (95.3 kg)  02/06/18 209 lb 6 oz (95 kg)  08/03/17 221 lb (100.2 kg)     Physical Exam  Constitutional: He appears well-developed  and well-nourished.  HENT:  Head: Normocephalic and atraumatic.  Right Ear: Tympanic membrane and external ear normal. No decreased hearing is noted.  Left Ear: Tympanic membrane and external ear normal. No decreased hearing is noted.  Mouth/Throat: No oropharyngeal exudate or posterior oropharyngeal erythema.  Eyes: Pupils are equal, round, and reactive to light.  Neck: Normal range of motion. Neck supple.  Cardiovascular: Normal rate and regular rhythm.  No murmur heard. Pulmonary/Chest: Breath sounds normal. No respiratory distress.  Abdominal: Soft. Bowel sounds are normal. He exhibits no mass. There is no tenderness.  Vitals reviewed.   No results found for: HGBA1C  Lab Results  Component Value Date   WBC 5.5 02/06/2018   HGB 16.2 02/06/2018   HCT 46.3 02/06/2018   PLT 226 02/06/2018   GLUCOSE 98 02/06/2018   CHOL 106 02/06/2018   TRIG 95 02/06/2018   HDL 40 02/06/2018   LDLCALC 47 02/06/2018   ALT 45 (H) 02/06/2018   AST 29 02/06/2018   NA 137 02/06/2018   K 4.5 02/06/2018   CL 101 02/06/2018   CREATININE 0.94 02/06/2018   BUN 15 02/06/2018   CO2 21 02/06/2018    US Abdomen Complete  Result Date: 02/01/2001 FINDINGS HISTORY: RIGHT UPPER QUADRANT PAIN. ULTRASOUND ABDOMEN COMPLETE: THERE IS NO EVIDENCE OF GALLSTONES, GALLBLADDER WALL THICKENING OR BILIARY DILATATION.  SLIGHT INCREASED LIVER ECHOGENICITY IS CONSISTENT WITH SLIGHT DIFFUSE FATTY INFILTRATION OF THE LIVER.  NO FOCAL PARENCHYMAL  LESIONS ARE IDENTIFIED.  THE PANCREAS IS UNREMARKABLE. THE KIDNEYS ARE WITHIN NORMAL LIMITS IN SIZE AND ECHOGENICITY AND THERE IS NO EVIDENCE OF MASSES OR HYDRONEPHROSIS.  THERE IS NO EVIDENCE OF SPLENOMEGALY, ASCITES OR ABDOMINAL AORTIC ANEURYSM. IMPRESSION 1.  SLIGHT DIFFUSE FATTY INFILTRATION OF THE LIVER. 2.  OTHERWISE, NORMAL.   Assessment & Plan:   Samin was seen today for hyperlipidemia.  Diagnoses and all orders for this visit:  Mixed hyperlipidemia -      CMP14+EGFR   I have discontinued Bobbyjoe T. Muhs's atorvastatin. I am also having him maintain his Omega-3 Fatty Acids (FISH OIL CONCENTRATE PO) and loratadine.  No orders of the defined types were placed in this encounter.    Follow-up: Return in about 6 months (around 03/11/2019).  Claretta Fraise, M.D.

## 2018-09-10 LAB — CMP14+EGFR
ALBUMIN: 4.7 g/dL (ref 3.5–5.5)
ALT: 38 IU/L (ref 0–44)
AST: 21 IU/L (ref 0–40)
Albumin/Globulin Ratio: 2.2 (ref 1.2–2.2)
Alkaline Phosphatase: 80 IU/L (ref 39–117)
BUN / CREAT RATIO: 11 (ref 9–20)
BUN: 10 mg/dL (ref 6–24)
Bilirubin Total: 0.6 mg/dL (ref 0.0–1.2)
CO2: 22 mmol/L (ref 20–29)
CREATININE: 0.92 mg/dL (ref 0.76–1.27)
Calcium: 9.9 mg/dL (ref 8.7–10.2)
Chloride: 103 mmol/L (ref 96–106)
GFR, EST AFRICAN AMERICAN: 113 mL/min/{1.73_m2} (ref >59)
GFR, EST NON AFRICAN AMERICAN: 97 mL/min/{1.73_m2} (ref >59)
GLOBULIN, TOTAL: 2.1 g/dL (ref 1.5–4.5)
GLUCOSE: 94 mg/dL (ref 65–99)
Potassium: 5.1 mmol/L (ref 3.5–5.2)
Sodium: 141 mmol/L (ref 134–144)
TOTAL PROTEIN: 6.8 g/dL (ref 6.0–8.5)

## 2019-03-11 ENCOUNTER — Encounter: Payer: 59 | Admitting: Family Medicine

## 2019-04-07 ENCOUNTER — Other Ambulatory Visit: Payer: 59

## 2019-04-07 ENCOUNTER — Other Ambulatory Visit: Payer: Self-pay | Admitting: Family Medicine

## 2019-04-07 ENCOUNTER — Other Ambulatory Visit: Payer: Self-pay

## 2019-04-07 DIAGNOSIS — Z0000 Encounter for general adult medical examination without abnormal findings: Secondary | ICD-10-CM

## 2019-04-07 DIAGNOSIS — Z1211 Encounter for screening for malignant neoplasm of colon: Secondary | ICD-10-CM

## 2019-04-07 DIAGNOSIS — E782 Mixed hyperlipidemia: Secondary | ICD-10-CM

## 2019-04-08 ENCOUNTER — Other Ambulatory Visit: Payer: Self-pay

## 2019-04-08 LAB — CBC WITH DIFFERENTIAL/PLATELET
Basophils Absolute: 0 10*3/uL (ref 0.0–0.2)
Basos: 0 %
EOS (ABSOLUTE): 0 10*3/uL (ref 0.0–0.4)
Eos: 0 %
Hematocrit: 48.3 % (ref 37.5–51.0)
Hemoglobin: 16.5 g/dL (ref 13.0–17.7)
Immature Grans (Abs): 0 10*3/uL (ref 0.0–0.1)
Immature Granulocytes: 0 %
Lymphocytes Absolute: 1.6 10*3/uL (ref 0.7–3.1)
Lymphs: 22 %
MCH: 31.4 pg (ref 26.6–33.0)
MCHC: 34.2 g/dL (ref 31.5–35.7)
MCV: 92 fL (ref 79–97)
Monocytes Absolute: 0.5 10*3/uL (ref 0.1–0.9)
Monocytes: 7 %
Neutrophils Absolute: 5.2 10*3/uL (ref 1.4–7.0)
Neutrophils: 71 %
Platelets: 220 10*3/uL (ref 150–450)
RBC: 5.26 x10E6/uL (ref 4.14–5.80)
RDW: 13.3 % (ref 11.6–15.4)
WBC: 7.3 10*3/uL (ref 3.4–10.8)

## 2019-04-08 LAB — CMP14+EGFR
ALT: 47 IU/L — ABNORMAL HIGH (ref 0–44)
AST: 25 IU/L (ref 0–40)
Albumin/Globulin Ratio: 2.3 — ABNORMAL HIGH (ref 1.2–2.2)
Albumin: 5 g/dL (ref 4.0–5.0)
Alkaline Phosphatase: 72 IU/L (ref 39–117)
BUN/Creatinine Ratio: 20 (ref 9–20)
BUN: 18 mg/dL (ref 6–24)
Bilirubin Total: 0.7 mg/dL (ref 0.0–1.2)
CO2: 22 mmol/L (ref 20–29)
Calcium: 10 mg/dL (ref 8.7–10.2)
Chloride: 103 mmol/L (ref 96–106)
Creatinine, Ser: 0.91 mg/dL (ref 0.76–1.27)
GFR calc Af Amer: 114 mL/min/{1.73_m2} (ref >59)
GFR calc non Af Amer: 99 mL/min/{1.73_m2} (ref >59)
Globulin, Total: 2.2 g/dL (ref 1.5–4.5)
Glucose: 86 mg/dL (ref 65–99)
Potassium: 4.1 mmol/L (ref 3.5–5.2)
Sodium: 139 mmol/L (ref 134–144)
Total Protein: 7.2 g/dL (ref 6.0–8.5)

## 2019-04-08 LAB — PSA TOTAL (REFLEX TO FREE): Prostate Specific Ag, Serum: 0.6 ng/mL (ref 0.0–4.0)

## 2019-04-08 LAB — LIPID PANEL
Chol/HDL Ratio: 6.3 ratio — ABNORMAL HIGH (ref 0.0–5.0)
Cholesterol, Total: 228 mg/dL — ABNORMAL HIGH (ref 100–199)
HDL: 36 mg/dL — ABNORMAL LOW (ref >39)
Triglycerides: 544 mg/dL — ABNORMAL HIGH (ref 0–149)

## 2019-04-08 LAB — TSH: TSH: 1.49 u[IU]/mL (ref 0.450–4.500)

## 2019-04-08 LAB — VITAMIN D 25 HYDROXY (VIT D DEFICIENCY, FRACTURES): Vit D, 25-Hydroxy: 26.1 ng/mL — ABNORMAL LOW (ref 30.0–100.0)

## 2019-04-09 ENCOUNTER — Ambulatory Visit (INDEPENDENT_AMBULATORY_CARE_PROVIDER_SITE_OTHER): Payer: 59 | Admitting: Family Medicine

## 2019-04-09 ENCOUNTER — Encounter: Payer: Self-pay | Admitting: Family Medicine

## 2019-04-09 VITALS — BP 147/88 | HR 89 | Temp 97.9°F | Ht 72.0 in | Wt 218.0 lb

## 2019-04-09 DIAGNOSIS — E782 Mixed hyperlipidemia: Secondary | ICD-10-CM

## 2019-04-09 DIAGNOSIS — Z0000 Encounter for general adult medical examination without abnormal findings: Secondary | ICD-10-CM

## 2019-04-09 DIAGNOSIS — Z0001 Encounter for general adult medical examination with abnormal findings: Secondary | ICD-10-CM

## 2019-04-09 NOTE — Progress Notes (Signed)
Subjective:  Patient ID: Joseph Spears, male    DOB: [birth year]  Age: 48 y.o. MRN: [id number]  CC: Annual Exam   HPI [name] presents for complete physical examination.  He had his blood work drawn yesterday and it was reviewed as noted below.  Patient tells me that he is unwilling at this time to take cholesterol medicine simply because he is not ready to accept the fact that he is no longer young even though his 48th birthday is approaching in 2 weeks.  Additionally he wants to delay colonoscopy for the same reason.  Patient is frustrated that he has to come in twice a year and states a strong preference to only come for office visit once annually.  Depression screen [facility] 2/9 04/09/2019 09/09/2018 02/06/2018  Decreased Interest 0 0 0  Down, Depressed, Hopeless 0 0 0  PHQ - 2 Score 0 0 0    History Fabien has a past medical history of Allergy.   He has a past surgical history that includes Wisdom tooth extraction and Nasal septum surgery.   His family history includes Arthritis in his mother; Diabetes in his father; Hyperlipidemia in his mother; Hypertension in his mother.He reports that he quit smoking about 21 years ago. His smoking use included cigarettes. He smoked 1.50 packs per day. He has never used smokeless tobacco. He reports that he does not drink alcohol or use drugs.  Current Outpatient Medications on File Prior to Visit  Medication Sig Dispense Refill  . loratadine (CLARITIN) 10 MG tablet Take 10 mg by mouth daily.    . Multiple Vitamin (MULTIVITAMIN) tablet Take 1 tablet by mouth daily.    . Omega-3 Fatty Acids (FISH OIL CONCENTRATE PO) Take by mouth.     No current facility-administered medications on file prior to visit.      ROS Review of Systems  Constitutional: Negative for activity change, fatigue and unexpected weight change.  HENT: Negative for congestion, ear pain, hearing loss, postnasal drip and trouble swallowing.   Eyes: Negative for pain and visual  disturbance.  Respiratory: Negative for cough, chest tightness and shortness of breath.   Cardiovascular: Negative for chest pain, palpitations and leg swelling.  Gastrointestinal: Negative for abdominal distention, abdominal pain, blood in stool, constipation, diarrhea, nausea and vomiting.  Endocrine: Negative for cold intolerance, heat intolerance and polydipsia.  Genitourinary: Negative for difficulty urinating, dysuria, flank pain, frequency and urgency.  Musculoskeletal: Negative for arthralgias and joint swelling.  Skin: Negative for color change, rash and wound.  Neurological: Negative for dizziness, syncope, speech difficulty, weakness, light-headedness, numbness and headaches.  Hematological: Does not bruise/bleed easily.  Psychiatric/Behavioral: Negative for confusion, decreased concentration, dysphoric mood and sleep disturbance. The patient is not nervous/anxious.     Objective:  BP (!) 147/88   Pulse 89   Temp 97.9 F (36.6 C) (Oral)   Ht 6' (1.829 m)   Wt 218 lb (98.9 kg)   BMI 29.57 kg/m   BP Readings from Last 3 Encounters:  04/09/19 (!) 147/88  09/09/18 133/75  02/06/18 116/81    Wt Readings from Last 3 Encounters:  04/09/19 218 lb (98.9 kg)  09/09/18 210 lb (95.3 kg)  02/06/18 209 lb 6 oz (95 kg)     Physical Exam Constitutional:      Appearance: He is well-developed.  HENT:     Head: Normocephalic and atraumatic.  Eyes:     Pupils: Pupils are equal, round, and reactive to light.  Neck:  Musculoskeletal: Normal range of motion.     Thyroid: No thyromegaly.     Trachea: No tracheal deviation.  Cardiovascular:     Rate and Rhythm: Normal rate and regular rhythm.     Heart sounds: Normal heart sounds. No murmur. No friction rub. No gallop.   Pulmonary:     Breath sounds: Normal breath sounds. No wheezing or rales.  Abdominal:     General: Bowel sounds are normal. There is no distension.     Palpations: Abdomen is soft. There is no mass.      Tenderness: There is no abdominal tenderness.     Hernia: There is no hernia in the left inguinal area.  Genitourinary:    Penis: Normal.      Scrotum/Testes: Normal.  Musculoskeletal: Normal range of motion.  Lymphadenopathy:     Cervical: No cervical adenopathy.  Skin:    General: Skin is warm and dry.  Neurological:     Mental Status: He is alert and oriented to person, place, and time.     Results for orders placed or performed in visit on 04/07/19  CBC with Differential/Platelet  Result Value Ref Range   WBC 7.3 3.4 - 10.8 x10E3/uL   RBC 5.26 4.14 - 5.80 x10E6/uL   Hemoglobin 16.5 13.0 - 17.7 g/dL   Hematocrit 48.3 37.5 - 51.0 %   MCV 92 79 - 97 fL   MCH 31.4 26.6 - 33.0 pg   MCHC 34.2 31.5 - 35.7 g/dL   RDW 13.3 11.6 - 15.4 %   Platelets 220 150 - 450 x10E3/uL   Neutrophils 71 Not Estab. %   Lymphs 22 Not Estab. %   Monocytes 7 Not Estab. %   Eos 0 Not Estab. %   Basos 0 Not Estab. %   Neutrophils Absolute 5.2 1.4 - 7.0 x10E3/uL   Lymphocytes Absolute 1.6 0.7 - 3.1 x10E3/uL   Monocytes Absolute 0.5 0.1 - 0.9 x10E3/uL   EOS (ABSOLUTE) 0.0 0.0 - 0.4 x10E3/uL   Basophils Absolute 0.0 0.0 - 0.2 x10E3/uL   Immature Granulocytes 0 Not Estab. %   Immature Grans (Abs) 0.0 0.0 - 0.1 x10E3/uL  CMP14+EGFR  Result Value Ref Range   Glucose 86 65 - 99 mg/dL   BUN 18 6 - 24 mg/dL   Creatinine, Ser 0.91 0.76 - 1.27 mg/dL   GFR calc non Af Amer 99 >59 mL/min/1.73   GFR calc Af Amer 114 >59 mL/min/1.73   BUN/Creatinine Ratio 20 9 - 20   Sodium 139 134 - 144 mmol/L   Potassium 4.1 3.5 - 5.2 mmol/L   Chloride 103 96 - 106 mmol/L   CO2 22 20 - 29 mmol/L   Calcium 10.0 8.7 - 10.2 mg/dL   Total Protein 7.2 6.0 - 8.5 g/dL   Albumin 5.0 4.0 - 5.0 g/dL   Globulin, Total 2.2 1.5 - 4.5 g/dL   Albumin/Globulin Ratio 2.3 (H) 1.2 - 2.2   Bilirubin Total 0.7 0.0 - 1.2 mg/dL   Alkaline Phosphatase 72 39 - 117 IU/L   AST 25 0 - 40 IU/L   ALT 47 (H) 0 - 44 IU/L  Lipid panel  Result  Value Ref Range   Cholesterol, Total 228 (H) 100 - 199 mg/dL   Triglycerides 544 (H) 0 - 149 mg/dL   HDL 36 (L) >39 mg/dL   VLDL Cholesterol Cal Comment 5 - 40 mg/dL   LDL Calculated Comment 0 - 99 mg/dL   Chol/HDL Ratio 6.3 (H) 0.0 -  5.0 ratio  PSA Total (Reflex To Free)  Result Value Ref Range   Prostate Specific Ag, Serum 0.6 0.0 - 4.0 ng/mL   Reflex Criteria Comment   TSH  Result Value Ref Range   TSH 1.490 0.450 - 4.500 uIU/mL  VITAMIN D 25 Hydroxy (Vit-D Deficiency, Fractures)  Result Value Ref Range   Vit D, 25-Hydroxy 26.1 (L) 30.0 - 100.0 ng/mL     Assessment & Plan:   Toron was seen today for annual exam.  Diagnoses and all orders for this visit:  Well adult exam  Mixed hyperlipidemia       I am having Aaron Edelman T. Decola maintain his Omega-3 Fatty Acids (FISH OIL CONCENTRATE PO), loratadine, and multivitamin.  Allergies as of 04/09/2019   No Known Allergies     Medication List       Accurate as of April 09, 2019  7:03 PM. If you have any questions, ask your nurse or doctor.        FISH OIL CONCENTRATE PO Take by mouth.   loratadine 10 MG tablet Commonly known as: CLARITIN Take 10 mg by mouth daily.   multivitamin tablet Take 1 tablet by mouth daily.        Follow-up: No follow-ups on file.  Claretta Fraise, M.D.

## 2019-04-15 ENCOUNTER — Other Ambulatory Visit: Payer: Self-pay | Admitting: Family Medicine

## 2019-04-15 ENCOUNTER — Telehealth: Payer: Self-pay | Admitting: Family Medicine

## 2019-04-15 MED ORDER — VITAMIN D (ERGOCALCIFEROL) 1.25 MG (50000 UNIT) PO CAPS: 50000.0000 [IU] | ORAL_CAPSULE | ORAL | 3 refills | Status: DC

## 2019-04-15 MED ORDER — ATORVASTATIN CALCIUM 40 MG PO TABS: 40.0000 mg | ORAL_TABLET | Freq: Every day | ORAL | 1 refills | Status: DC

## 2019-04-15 MED FILL — ATORVASTATIN 40 MG TABLET: 40 | 90 days supply | Qty: 90 | Fill #0

## 2019-04-15 MED FILL — VIT D2 1.25 MG (50,000 UNIT: 1.25 MG | 90 days supply | Qty: 13 | Fill #0

## 2019-04-15 NOTE — Telephone Encounter (Signed)
No cholesterol medication on medication list - please advise.  Please also review labs from 6/29 and advise

## 2019-04-15 NOTE — Telephone Encounter (Signed)
Patient aware.

## 2019-04-15 NOTE — Telephone Encounter (Signed)
I sent in the requested prescription 

## 2019-05-13 DIAGNOSIS — Z20828 Contact with and (suspected) exposure to other viral communicable diseases: Secondary | ICD-10-CM | POA: Diagnosis not present

## 2019-07-15 MED FILL — VIT D2 1.25 MG (50,000 UNIT: 1.25 MG | 90 days supply | Qty: 13 | Fill #1

## 2019-07-15 MED FILL — ATORVASTATIN 40 MG TABLET: 40 | 90 days supply | Qty: 90 | Fill #1

## 2019-10-20 ENCOUNTER — Ambulatory Visit (INDEPENDENT_AMBULATORY_CARE_PROVIDER_SITE_OTHER): Payer: 59 | Admitting: Family Medicine

## 2019-10-20 ENCOUNTER — Encounter: Payer: Self-pay | Admitting: Family Medicine

## 2019-10-20 DIAGNOSIS — E782 Mixed hyperlipidemia: Secondary | ICD-10-CM

## 2019-10-20 DIAGNOSIS — E559 Vitamin D deficiency, unspecified: Secondary | ICD-10-CM

## 2019-10-20 MED ORDER — VITAMIN D (ERGOCALCIFEROL) 1.25 MG (50000 UNIT) PO CAPS: 50000.0000 [IU] | ORAL_CAPSULE | ORAL | 3 refills | Status: DC

## 2019-10-20 MED ORDER — ATORVASTATIN CALCIUM 40 MG PO TABS: 40.0000 mg | ORAL_TABLET | Freq: Every day | ORAL | 1 refills | Status: DC

## 2019-10-20 MED FILL — ATORVASTATIN 40 MG TABLET: 40 | 90 days supply | Qty: 90 | Fill #0

## 2019-10-20 MED FILL — VIT D2 1.25 MG (50,000 UNIT: 1.25 MG | 89 days supply | Qty: 13 | Fill #0

## 2019-10-20 NOTE — Progress Notes (Signed)
Subjective:    Patient ID: Joseph Spears, male    DOB: [birth year], 49 y.o.   MRN: [id number]   HPI: Joseph Spears is a 49 y.o. male presenting for  in for follow-up of elevated cholesterol. Doing well without complaints on current medication. Denies side effects of statin including myalgia and arthralgia and nausea. Currently no chest pain, shortness of breath or other cardiovascular related symptoms noted.    Depression screen [facility] 2/9 04/09/2019 09/09/2018 02/06/2018 08/03/2017 01/03/2017  Decreased Interest 0 0 0 0 0  Down, Depressed, Hopeless 0 0 0 0 0  PHQ - 2 Score 0 0 0 0 0     Relevant past medical, surgical, family and social history reviewed and updated as indicated.  Interim medical history since our last visit reviewed. Allergies and medications reviewed and updated.  ROS:  Review of Systems   Social History   Tobacco Use  Smoking Status Former Smoker  . Packs/day: 1.50  . Types: Cigarettes  . Quit date: 01/03/1998  . Years since quitting: 21.8  Smokeless Tobacco Never Used       Objective:     Wt Readings from Last 3 Encounters:  04/09/19 218 lb (98.9 kg)  09/09/18 210 lb (95.3 kg)  02/06/18 209 lb 6 oz (95 kg)     Exam deferred. Pt. Harboring due to COVID 19. Phone visit performed.   Assessment & Plan:   1. Mixed hyperlipidemia   2. Vitamin D deficiency     Meds ordered this encounter  Medications  . atorvastatin (LIPITOR) 40 MG tablet    Sig: Take 1 tablet (40 mg total) by mouth daily.    Dispense:  90 tablet    Refill:  1  . Vitamin D, Ergocalciferol, (DRISDOL) 1.25 MG (50000 UT) CAPS capsule    Sig: Take 1 capsule (50,000 Units total) by mouth every 7 (seven) days.    Dispense:  13 capsule    Refill:  3    Orders Placed This Encounter  Procedures  . CMP    Order Specific Question:   Has the patient fasted?    Answer:   Yes  . Lipid    Order Specific Question:   Has the patient fasted?    Answer:   Yes  . VITAMIN D       Diagnoses and all orders for this visit:  Mixed hyperlipidemia -     CMP -     Lipid  Vitamin D deficiency -     VITAMIN D  Other orders -     atorvastatin (LIPITOR) 40 MG tablet; Take 1 tablet (40 mg total) by mouth daily. -     Vitamin D, Ergocalciferol, (DRISDOL) 1.25 MG (50000 UT) CAPS capsule; Take 1 capsule (50,000 Units total) by mouth every 7 (seven) days.    Virtual Visit via telephone Note  I discussed the limitations, risks, security and privacy concerns of performing an evaluation and management service by telephone and the availability of in person appointments. The patient was identified with two identifiers. Pt.expressed understanding and agreed to proceed. Pt. Is at home. Dr. Livia Snellen is in his office.  Follow Up Instructions:   I discussed the assessment and treatment plan with the patient. The patient was provided an opportunity to ask questions and all were answered. The patient agreed with the plan and demonstrated an understanding of the instructions.   The patient was advised to call back or seek an in-person evaluation if the  symptoms worsen or if the condition fails to improve as anticipated.   Total minutes including chart review and phone contact time: 17   Follow up plan: Return in about 6 months (around 04/18/2020).  Claretta Fraise, MD [place]

## 2019-10-21 ENCOUNTER — Other Ambulatory Visit: Payer: 59

## 2019-10-21 ENCOUNTER — Other Ambulatory Visit: Payer: Self-pay

## 2019-10-21 DIAGNOSIS — E782 Mixed hyperlipidemia: Secondary | ICD-10-CM | POA: Diagnosis not present

## 2019-10-21 DIAGNOSIS — E559 Vitamin D deficiency, unspecified: Secondary | ICD-10-CM | POA: Diagnosis not present

## 2019-10-22 LAB — CMP14+EGFR
ALT: 57 IU/L — ABNORMAL HIGH (ref 0–44)
AST: 30 IU/L (ref 0–40)
Albumin/Globulin Ratio: 2.4 — ABNORMAL HIGH (ref 1.2–2.2)
Albumin: 5.1 g/dL — ABNORMAL HIGH (ref 4.0–5.0)
Alkaline Phosphatase: 93 IU/L (ref 39–117)
BUN/Creatinine Ratio: 13 (ref 9–20)
BUN: 13 mg/dL (ref 6–24)
Bilirubin Total: 0.8 mg/dL (ref 0.0–1.2)
CO2: 23 mmol/L (ref 20–29)
Calcium: 9.9 mg/dL (ref 8.7–10.2)
Chloride: 103 mmol/L (ref 96–106)
Creatinine, Ser: 1.04 mg/dL (ref 0.76–1.27)
GFR calc Af Amer: 96 mL/min/{1.73_m2} (ref >59)
GFR calc non Af Amer: 83 mL/min/{1.73_m2} (ref >59)
Globulin, Total: 2.1 g/dL (ref 1.5–4.5)
Glucose: 103 mg/dL — ABNORMAL HIGH (ref 65–99)
Potassium: 4.7 mmol/L (ref 3.5–5.2)
Sodium: 140 mmol/L (ref 134–144)
Total Protein: 7.2 g/dL (ref 6.0–8.5)

## 2019-10-22 LAB — LIPID PANEL
Chol/HDL Ratio: 3.1 ratio (ref 0.0–5.0)
Cholesterol, Total: 119 mg/dL (ref 100–199)
HDL: 39 mg/dL — ABNORMAL LOW (ref >39)
LDL Chol Calc (NIH): 50 mg/dL (ref 0–99)
Triglycerides: 181 mg/dL — ABNORMAL HIGH (ref 0–149)
VLDL Cholesterol Cal: 30 mg/dL (ref 5–40)

## 2019-10-22 LAB — VITAMIN D 25 HYDROXY (VIT D DEFICIENCY, FRACTURES): Vit D, 25-Hydroxy: 44.8 ng/mL (ref 30.0–100.0)

## 2019-10-24 NOTE — Progress Notes (Signed)
Hello Joseph Spears,  Your lab result is normal and/or stable.Some minor variations that are not significant are commonly marked abnormal, but do not represent any medical problem for you.  Best regards, Mario Coronado, M.D.

## 2020-01-20 MED FILL — VIT D2 1.25 MG (50,000 UNIT: 1.25 MG | 89 days supply | Qty: 13 | Fill #1

## 2020-01-20 MED FILL — ATORVASTATIN 40 MG TABLET: 40 | 90 days supply | Qty: 90 | Fill #1

## 2020-04-26 ENCOUNTER — Encounter: Payer: Self-pay | Admitting: Family Medicine

## 2020-04-26 ENCOUNTER — Ambulatory Visit (INDEPENDENT_AMBULATORY_CARE_PROVIDER_SITE_OTHER): Payer: 59 | Admitting: Family Medicine

## 2020-04-26 ENCOUNTER — Other Ambulatory Visit: Payer: Self-pay | Admitting: Family Medicine

## 2020-04-26 ENCOUNTER — Other Ambulatory Visit: Payer: Self-pay

## 2020-04-26 VITALS — BP 141/98 | HR 81 | Temp 97.8°F | Resp 20 | Ht 72.0 in | Wt 224.0 lb

## 2020-04-26 DIAGNOSIS — Z114 Encounter for screening for human immunodeficiency virus [HIV]: Secondary | ICD-10-CM

## 2020-04-26 DIAGNOSIS — Z125 Encounter for screening for malignant neoplasm of prostate: Secondary | ICD-10-CM | POA: Diagnosis not present

## 2020-04-26 DIAGNOSIS — Z1211 Encounter for screening for malignant neoplasm of colon: Secondary | ICD-10-CM | POA: Diagnosis not present

## 2020-04-26 DIAGNOSIS — Z23 Encounter for immunization: Secondary | ICD-10-CM

## 2020-04-26 DIAGNOSIS — Z0000 Encounter for general adult medical examination without abnormal findings: Secondary | ICD-10-CM

## 2020-04-26 DIAGNOSIS — Z0001 Encounter for general adult medical examination with abnormal findings: Secondary | ICD-10-CM

## 2020-04-26 DIAGNOSIS — E782 Mixed hyperlipidemia: Secondary | ICD-10-CM | POA: Diagnosis not present

## 2020-04-26 DIAGNOSIS — Z1159 Encounter for screening for other viral diseases: Secondary | ICD-10-CM | POA: Diagnosis not present

## 2020-04-26 DIAGNOSIS — E559 Vitamin D deficiency, unspecified: Secondary | ICD-10-CM

## 2020-04-26 HISTORY — DX: Mixed hyperlipidemia: E78.2

## 2020-04-26 LAB — URINALYSIS
Bilirubin, UA: NEGATIVE
Glucose, UA: NEGATIVE
Ketones, UA: NEGATIVE
Leukocytes,UA: NEGATIVE
Nitrite, UA: NEGATIVE
Protein,UA: NEGATIVE
Specific Gravity, UA: 1.025 (ref 1.005–1.030)
Urobilinogen, Ur: 0.2 mg/dL (ref 0.2–1.0)
pH, UA: 6 (ref 5.0–7.5)

## 2020-04-26 MED ORDER — VITAMIN D (ERGOCALCIFEROL) 1.25 MG (50000 UNIT) PO CAPS: 50000.0000 [IU] | ORAL_CAPSULE | ORAL | 3 refills | Status: DC

## 2020-04-26 MED ORDER — ATORVASTATIN CALCIUM 40 MG PO TABS: 40.0000 mg | ORAL_TABLET | Freq: Every day | ORAL | 1 refills | Status: DC

## 2020-04-26 MED FILL — ATORVASTATIN 40 MG TABLET: 40 | 90 days supply | Qty: 90 | Fill #0

## 2020-04-26 MED FILL — VIT D2 1.25 MG (50,000 UNIT: 1.25 MG | 90 days supply | Qty: 13 | Fill #0

## 2020-04-26 NOTE — Addendum Note (Signed)
Addended by: Michaela Corner on: 04/26/2020 03:02 PM   Modules accepted: Orders

## 2020-04-26 NOTE — Progress Notes (Signed)
Subjective:  Patient ID: Joseph Spears, male    DOB: 11-15-1970  Age: 49 y.o. MRN: [id number]  CC: Annual Exam   HPI Joseph Spears presents for annual physical examination.  Patient reports no specific medical problems he does want to go ahead with colonoscopy that is been discussed in the past and delayed.  He has been regularly taking atorvastatin for his cholesterol without significant side effects.  He is taking weekly vitamin D as well.  Depression screen [facility] 2/9 04/26/2020 04/09/2019 09/09/2018  Decreased Interest 0 0 0  Down, Depressed, Hopeless 0 0 0  PHQ - 2 Score 0 0 0    History Joseph Spears has a past medical history of Allergy and Mixed hyperlipidemia (04/26/2020).   He has a past surgical history that includes Wisdom tooth extraction and Nasal septum surgery.   His family history includes Arthritis in his mother; Diabetes in his father; Hyperlipidemia in his mother; Hypertension in his mother.He reports that he quit smoking about 22 years ago. His smoking use included cigarettes. He smoked 1.50 packs per day. He has never used smokeless tobacco. He reports that he does not drink alcohol and does not use drugs.    ROS Review of Systems  Constitutional: Negative for activity change, fatigue and unexpected weight change.  HENT: Negative for congestion, ear pain, hearing loss, postnasal drip and trouble swallowing.   Eyes: Negative for pain and visual disturbance.  Respiratory: Negative for cough, chest tightness and shortness of breath.   Cardiovascular: Negative for chest pain, palpitations and leg swelling.  Gastrointestinal: Negative for abdominal distention, abdominal pain, blood in stool, constipation, diarrhea, nausea and vomiting.  Endocrine: Negative for cold intolerance, heat intolerance and polydipsia.  Genitourinary: Negative for difficulty urinating, dysuria, flank pain, frequency and urgency.  Musculoskeletal: Positive for arthralgias (left knee, intermittent, hands,  mild). Negative for joint swelling.  Skin: Negative for color change, rash and wound.  Neurological: Negative for dizziness, syncope, speech difficulty, weakness, light-headedness, numbness and headaches.  Hematological: Does not bruise/bleed easily.  Psychiatric/Behavioral: Negative for confusion, decreased concentration, dysphoric mood and sleep disturbance. The patient is not nervous/anxious.     Objective:  BP (!) 141/98   Pulse 81   Temp 97.8 F (36.6 C) (Temporal)   Resp 20   Ht 6' (1.829 m)   Wt 224 lb (101.6 kg)   SpO2 98%   BMI 30.38 kg/m   BP Readings from Last 3 Encounters:  04/26/20 (!) 141/98  04/09/19 (!) 147/88  09/09/18 133/75    Wt Readings from Last 3 Encounters:  04/26/20 224 lb (101.6 kg)  04/09/19 218 lb (98.9 kg)  09/09/18 210 lb (95.3 kg)     Physical Exam Constitutional:      Appearance: He is well-developed.  HENT:     Head: Normocephalic and atraumatic.  Eyes:     Pupils: Pupils are equal, round, and reactive to light.  Neck:     Thyroid: No thyromegaly.     Trachea: No tracheal deviation.  Cardiovascular:     Rate and Rhythm: Normal rate and regular rhythm.     Heart sounds: Normal heart sounds. No murmur heard.  No friction rub. No gallop.   Pulmonary:     Breath sounds: Normal breath sounds. No wheezing or rales.  Abdominal:     General: Bowel sounds are normal. There is no distension.     Palpations: Abdomen is soft. There is no mass.     Tenderness: There is no  abdominal tenderness.     Hernia: There is no hernia in the left inguinal area.  Genitourinary:    Penis: Normal.      Testes: Normal.  Musculoskeletal:        General: Normal range of motion.     Cervical back: Normal range of motion.  Lymphadenopathy:     Cervical: No cervical adenopathy.  Skin:    General: Skin is warm and dry.  Neurological:     Mental Status: He is alert and oriented to person, place, and time.       Assessment & Plan:   Joseph Spears was seen  today for annual exam.  Diagnoses and all orders for this visit:  Mixed hyperlipidemia -     CBC with Differential/Platelet -     CMP14+EGFR -     Lipid panel -     Urinalysis -     HIV Antibody (routine testing w rflx)  Vitamin D deficiency -     CBC with Differential/Platelet -     CMP14+EGFR -     Lipid panel -     Urinalysis -     VITAMIN D 25 Hydroxy (Vit-D Deficiency, Fractures) -     HIV Antibody (routine testing w rflx)  Well adult exam -     CBC with Differential/Platelet -     CMP14+EGFR -     Lipid panel -     Urinalysis -     HIV Antibody (routine testing w rflx)  Screen for colon cancer -     Ambulatory referral to Gastroenterology  Screening for prostate cancer -     PSA Total (Reflex To Free)  Need for hepatitis C screening test -     Hepatitis C antibody  Encounter for screening for HIV -     HIV Antibody (routine testing w rflx)  Other orders -     atorvastatin (LIPITOR) 40 MG tablet; Take 1 tablet (40 mg total) by mouth daily. -     Vitamin D, Ergocalciferol, (DRISDOL) 1.25 MG (50000 UNIT) CAPS capsule; Take 1 capsule (50,000 Units total) by mouth every 7 (seven) days.       I have changed Joseph Spears's Vitamin D (Ergocalciferol). I am also having him maintain his Omega-3 Fatty Acids (FISH OIL CONCENTRATE PO), loratadine, multivitamin, and atorvastatin.  Allergies as of 04/26/2020   No Known Allergies     Medication List       Accurate as of April 26, 2020  2:00 PM. If you have any questions, ask your nurse or doctor.        atorvastatin 40 MG tablet Commonly known as: LIPITOR Take 1 tablet (40 mg total) by mouth daily.   FISH OIL CONCENTRATE PO Take by mouth.   loratadine 10 MG tablet Commonly known as: CLARITIN Take 10 mg by mouth daily.   multivitamin tablet Take 1 tablet by mouth daily.   Vitamin D (Ergocalciferol) 1.25 MG (50000 UNIT) Caps capsule Commonly known as: DRISDOL Take 1 capsule (50,000 Units total) by  mouth every 7 (seven) days.        Follow-up: Return in about 6 months (around 10/27/2020).  Claretta Fraise, M.D.

## 2020-04-27 LAB — CBC WITH DIFFERENTIAL/PLATELET
Basophils Absolute: 0 10*3/uL (ref 0.0–0.2)
Basos: 0 %
EOS (ABSOLUTE): 0 10*3/uL (ref 0.0–0.4)
Eos: 0 %
Hematocrit: 49.7 % (ref 37.5–51.0)
Hemoglobin: 16.5 g/dL (ref 13.0–17.7)
Immature Grans (Abs): 0 10*3/uL (ref 0.0–0.1)
Immature Granulocytes: 0 %
Lymphocytes Absolute: 1.5 10*3/uL (ref 0.7–3.1)
Lymphs: 23 %
MCH: 30.2 pg (ref 26.6–33.0)
MCHC: 33.2 g/dL (ref 31.5–35.7)
MCV: 91 fL (ref 79–97)
Monocytes Absolute: 0.4 10*3/uL (ref 0.1–0.9)
Monocytes: 6 %
Neutrophils Absolute: 4.7 10*3/uL (ref 1.4–7.0)
Neutrophils: 71 %
Platelets: 208 10*3/uL (ref 150–450)
RBC: 5.47 x10E6/uL (ref 4.14–5.80)
RDW: 13 % (ref 11.6–15.4)
WBC: 6.6 10*3/uL (ref 3.4–10.8)

## 2020-04-27 LAB — CMP14+EGFR
ALT: 54 IU/L — ABNORMAL HIGH (ref 0–44)
AST: 36 IU/L (ref 0–40)
Albumin/Globulin Ratio: 1.8 (ref 1.2–2.2)
Albumin: 4.8 g/dL (ref 3.8–4.9)
Alkaline Phosphatase: 93 IU/L (ref 48–121)
BUN/Creatinine Ratio: 13 (ref 9–20)
BUN: 11 mg/dL (ref 6–24)
Bilirubin Total: 0.9 mg/dL (ref 0.0–1.2)
CO2: 23 mmol/L (ref 20–29)
Calcium: 9.8 mg/dL (ref 8.7–10.2)
Chloride: 100 mmol/L (ref 96–106)
Creatinine, Ser: 0.86 mg/dL (ref 0.76–1.27)
GFR calc Af Amer: 116 mL/min/{1.73_m2} (ref >59)
GFR calc non Af Amer: 100 mL/min/{1.73_m2} (ref >59)
Globulin, Total: 2.6 g/dL (ref 1.5–4.5)
Glucose: 84 mg/dL (ref 65–99)
Potassium: 4.5 mmol/L (ref 3.5–5.2)
Sodium: 138 mmol/L (ref 134–144)
Total Protein: 7.4 g/dL (ref 6.0–8.5)

## 2020-04-27 LAB — PSA TOTAL (REFLEX TO FREE): Prostate Specific Ag, Serum: 0.6 ng/mL (ref 0.0–4.0)

## 2020-04-27 LAB — LIPID PANEL
Chol/HDL Ratio: 3.4 ratio (ref 0.0–5.0)
Cholesterol, Total: 129 mg/dL (ref 100–199)
HDL: 38 mg/dL — ABNORMAL LOW (ref >39)
LDL Chol Calc (NIH): 45 mg/dL (ref 0–99)
Triglycerides: 300 mg/dL — ABNORMAL HIGH (ref 0–149)
VLDL Cholesterol Cal: 46 mg/dL — ABNORMAL HIGH (ref 5–40)

## 2020-04-27 LAB — HEPATITIS C ANTIBODY: Hep C Virus Ab: <0.1 s/co ratio (ref 0.0–0.9)

## 2020-04-27 LAB — VITAMIN D 25 HYDROXY (VIT D DEFICIENCY, FRACTURES): Vit D, 25-Hydroxy: 42.6 ng/mL (ref 30.0–100.0)

## 2020-04-27 LAB — HIV ANTIBODY (ROUTINE TESTING W REFLEX): HIV Screen 4th Generation wRfx: NONREACTIVE

## 2020-04-27 NOTE — Progress Notes (Signed)
Hello Joseph Spears,  Your lab result is normal and/or stable.Some minor variations that are not significant are commonly marked abnormal, but do not represent any medical problem for you.  Best regards, Levi Crass, M.D.

## 2020-05-03 ENCOUNTER — Encounter: Payer: Self-pay | Admitting: Internal Medicine

## 2020-06-02 ENCOUNTER — Ambulatory Visit: Payer: Self-pay

## 2020-07-29 MED FILL — VIT D2 1.25 MG (50,000 UNIT: 1.25 MG | 90 days supply | Qty: 13 | Fill #1

## 2020-07-29 MED FILL — ATORVASTATIN 40 MG TABLET: 40 | 90 days supply | Qty: 90 | Fill #1

## 2020-11-09 ENCOUNTER — Other Ambulatory Visit: Payer: Self-pay

## 2020-11-09 ENCOUNTER — Ambulatory Visit (INDEPENDENT_AMBULATORY_CARE_PROVIDER_SITE_OTHER): Payer: 59 | Admitting: Family Medicine

## 2020-11-09 ENCOUNTER — Other Ambulatory Visit: Payer: Self-pay | Admitting: Family Medicine

## 2020-11-09 ENCOUNTER — Encounter: Payer: Self-pay | Admitting: Family Medicine

## 2020-11-09 VITALS — BP 135/78 | HR 78 | Temp 97.9°F | Ht 72.0 in | Wt 219.0 lb

## 2020-11-09 DIAGNOSIS — Z1211 Encounter for screening for malignant neoplasm of colon: Secondary | ICD-10-CM

## 2020-11-09 DIAGNOSIS — E782 Mixed hyperlipidemia: Secondary | ICD-10-CM | POA: Diagnosis not present

## 2020-11-09 DIAGNOSIS — E663 Overweight: Secondary | ICD-10-CM

## 2020-11-09 DIAGNOSIS — E559 Vitamin D deficiency, unspecified: Secondary | ICD-10-CM

## 2020-11-09 MED ORDER — ATORVASTATIN CALCIUM 40 MG PO TABS: 40.0000 mg | ORAL_TABLET | Freq: Every day | ORAL | 1 refills | Status: DC

## 2020-11-09 MED FILL — ATORVASTATIN 40 MG TABLET: 40 | 90 days supply | Qty: 90 | Fill #0

## 2020-11-09 NOTE — Progress Notes (Signed)
Subjective:  Patient ID: Joseph Spears, male    DOB: 06-23-1971  Age: 50 y.o. MRN: [id number]  CC: Medical Management of Chronic Issues (6 month/)   HPI Joseph Spears presents for follow-up of elevated cholesterol. Doing well without complaints on current medication. Denies side effects of statin including myalgia and arthralgia and nausea. Also in today for liver function testing. Currently no chest pain, shortness of breath or other cardiovascular related symptoms noted. History Joseph Spears has a past medical history of Allergy and Mixed hyperlipidemia (04/26/2020).   He has a past surgical history that includes Wisdom tooth extraction and Nasal septum surgery.   His family history includes Arthritis in his mother; Diabetes in his father; Hyperlipidemia in his mother; Hypertension in his mother.He reports that he quit smoking about 22 years ago. His smoking use included cigarettes. He smoked 1.50 packs per day. He has never used smokeless tobacco. He reports that he does not drink alcohol and does not use drugs.  Current Outpatient Medications on File Prior to Visit  Medication Sig Dispense Refill  . loratadine (CLARITIN) 10 MG tablet Take 10 mg by mouth daily.    . Multiple Vitamin (MULTIVITAMIN) tablet Take 1 tablet by mouth daily.    . Omega-3 Fatty Acids (FISH OIL CONCENTRATE PO) Take by mouth.    . Vitamin D, Ergocalciferol, (DRISDOL) 1.25 MG (50000 UNIT) CAPS capsule Take 1 capsule (50,000 Units total) by mouth every 7 (seven) days. 13 capsule 3   No current facility-administered medications on file prior to visit.    ROS Review of Systems  Constitutional: Negative for fever.  Respiratory: Negative for shortness of breath.   Cardiovascular: Negative for chest pain.  Musculoskeletal: Negative for arthralgias.  Skin: Negative for rash.    Objective:  BP 135/78   Pulse 78   Temp 97.9 F (36.6 C) (Temporal)   Ht 6' (1.829 m)   Wt 219 lb (99.3 kg)   BMI 29.70 kg/m   BP  Readings from Last 3 Encounters:  11/09/20 135/78  04/26/20 (!) 141/98  04/09/19 (!) 147/88    Wt Readings from Last 3 Encounters:  11/09/20 219 lb (99.3 kg)  04/26/20 224 lb (101.6 kg)  04/09/19 218 lb (98.9 kg)     Physical Exam Vitals reviewed.  Constitutional:      Appearance: He is well-developed and well-nourished.  HENT:     Head: Normocephalic and atraumatic.     Right Ear: Tympanic membrane and external ear normal. No decreased hearing noted.     Left Ear: Tympanic membrane and external ear normal. No decreased hearing noted.     Mouth/Throat:     Pharynx: No oropharyngeal exudate or posterior oropharyngeal erythema.  Eyes:     Pupils: Pupils are equal, round, and reactive to light.  Cardiovascular:     Rate and Rhythm: Normal rate and regular rhythm.     Heart sounds: No murmur heard.   Pulmonary:     Effort: No respiratory distress.     Breath sounds: Normal breath sounds.  Abdominal:     General: Bowel sounds are normal.     Palpations: Abdomen is soft. There is no mass.     Tenderness: There is no abdominal tenderness.  Musculoskeletal:     Cervical back: Normal range of motion and neck supple.     No results found for: HGBA1C  Lab Results  Component Value Date   WBC 6.6 04/26/2020   HGB 16.5 04/26/2020   HCT  49.7 04/26/2020   PLT 208 04/26/2020   GLUCOSE 84 04/26/2020   CHOL 129 04/26/2020   TRIG 300 (H) 04/26/2020   HDL 38 (L) 04/26/2020   LDLCALC 45 04/26/2020   ALT 54 (H) 04/26/2020   AST 36 04/26/2020   NA 138 04/26/2020   K 4.5 04/26/2020   CL 100 04/26/2020   CREATININE 0.86 04/26/2020   BUN 11 04/26/2020   CO2 23 04/26/2020   TSH 1.490 04/07/2019    US Abdomen Complete  Result Date: 02/01/2001 FINDINGS HISTORY: RIGHT UPPER QUADRANT PAIN. ULTRASOUND ABDOMEN COMPLETE: THERE IS NO EVIDENCE OF GALLSTONES, GALLBLADDER WALL THICKENING OR BILIARY DILATATION.  SLIGHT INCREASED LIVER ECHOGENICITY IS CONSISTENT WITH SLIGHT DIFFUSE FATTY  INFILTRATION OF THE LIVER.  NO FOCAL PARENCHYMAL LESIONS ARE IDENTIFIED.  THE PANCREAS IS UNREMARKABLE. THE KIDNEYS ARE WITHIN NORMAL LIMITS IN SIZE AND ECHOGENICITY AND THERE IS NO EVIDENCE OF MASSES OR HYDRONEPHROSIS.  THERE IS NO EVIDENCE OF SPLENOMEGALY, ASCITES OR ABDOMINAL AORTIC ANEURYSM. IMPRESSION 1.  SLIGHT DIFFUSE FATTY INFILTRATION OF THE LIVER. 2.  OTHERWISE, NORMAL.   Assessment & Plan:   Joseph Spears was seen today for medical management of chronic issues.  Diagnoses and all orders for this visit:  Mixed hyperlipidemia -     CBC with Differential/Platelet -     CMP14+EGFR -     Lipid panel  Vitamin D deficiency -     CBC with Differential/Platelet -     CMP14+EGFR -     VITAMIN D 25 Hydroxy (Vit-D Deficiency, Fractures)  Overweight (BMI 25.0-29.9)  Screen for colon cancer -     Ambulatory referral to Gastroenterology  Other orders -     atorvastatin (LIPITOR) 40 MG tablet; Take 1 tablet (40 mg total) by mouth daily.   I am having Joseph Spears maintain his Omega-3 Fatty Acids (FISH OIL CONCENTRATE PO), loratadine, multivitamin, Vitamin D (Ergocalciferol), and atorvastatin.  Meds ordered this encounter  Medications  . atorvastatin (LIPITOR) 40 MG tablet    Sig: Take 1 tablet (40 mg total) by mouth daily.    Dispense:  90 tablet    Refill:  1   Weight loss strategies reviewed  Follow-up: Return in about 6 months (around 05/09/2021) for [facility] physical.  Claretta Fraise, M.D.

## 2020-11-10 LAB — CBC WITH DIFFERENTIAL/PLATELET
Basophils Absolute: 0 10*3/uL (ref 0.0–0.2)
Basos: 1 %
EOS (ABSOLUTE): 0 10*3/uL (ref 0.0–0.4)
Eos: 1 %
Hematocrit: 49.3 % (ref 37.5–51.0)
Hemoglobin: 16.3 g/dL (ref 13.0–17.7)
Immature Grans (Abs): 0 10*3/uL (ref 0.0–0.1)
Immature Granulocytes: 0 %
Lymphocytes Absolute: 1.2 10*3/uL (ref 0.7–3.1)
Lymphs: 29 %
MCH: 29.8 pg (ref 26.6–33.0)
MCHC: 33.1 g/dL (ref 31.5–35.7)
MCV: 90 fL (ref 79–97)
Monocytes Absolute: 0.3 10*3/uL (ref 0.1–0.9)
Monocytes: 8 %
Neutrophils Absolute: 2.6 10*3/uL (ref 1.4–7.0)
Neutrophils: 61 %
Platelets: 203 10*3/uL (ref 150–450)
RBC: 5.47 x10E6/uL (ref 4.14–5.80)
RDW: 13 % (ref 11.6–15.4)
WBC: 4.3 10*3/uL (ref 3.4–10.8)

## 2020-11-10 LAB — CMP14+EGFR
ALT: 49 IU/L — ABNORMAL HIGH (ref 0–44)
AST: 27 IU/L (ref 0–40)
Albumin/Globulin Ratio: 2 (ref 1.2–2.2)
Albumin: 4.7 g/dL (ref 3.8–4.9)
Alkaline Phosphatase: 91 IU/L (ref 44–121)
BUN/Creatinine Ratio: 15 (ref 9–20)
BUN: 14 mg/dL (ref 6–24)
Bilirubin Total: 0.5 mg/dL (ref 0.0–1.2)
CO2: 23 mmol/L (ref 20–29)
Calcium: 10.1 mg/dL (ref 8.7–10.2)
Chloride: 103 mmol/L (ref 96–106)
Creatinine, Ser: 0.91 mg/dL (ref 0.76–1.27)
GFR calc Af Amer: 112 mL/min/{1.73_m2} (ref >59)
GFR calc non Af Amer: 97 mL/min/{1.73_m2} (ref >59)
Globulin, Total: 2.4 g/dL (ref 1.5–4.5)
Glucose: 93 mg/dL (ref 65–99)
Potassium: 4.8 mmol/L (ref 3.5–5.2)
Sodium: 142 mmol/L (ref 134–144)
Total Protein: 7.1 g/dL (ref 6.0–8.5)

## 2020-11-10 LAB — LIPID PANEL
Chol/HDL Ratio: 4 ratio (ref 0.0–5.0)
Cholesterol, Total: 167 mg/dL (ref 100–199)
HDL: 42 mg/dL (ref >39)
LDL Chol Calc (NIH): 88 mg/dL (ref 0–99)
Triglycerides: 222 mg/dL — ABNORMAL HIGH (ref 0–149)
VLDL Cholesterol Cal: 37 mg/dL (ref 5–40)

## 2020-11-10 LAB — VITAMIN D 25 HYDROXY (VIT D DEFICIENCY, FRACTURES): Vit D, 25-Hydroxy: 40.8 ng/mL (ref 30.0–100.0)

## 2021-01-18 ENCOUNTER — Other Ambulatory Visit (HOSPITAL_COMMUNITY): Payer: Self-pay

## 2021-01-20 ENCOUNTER — Ambulatory Visit (AMBULATORY_SURGERY_CENTER): Payer: Self-pay

## 2021-01-20 ENCOUNTER — Other Ambulatory Visit: Payer: Self-pay

## 2021-01-20 ENCOUNTER — Other Ambulatory Visit (HOSPITAL_COMMUNITY): Payer: Self-pay

## 2021-01-20 VITALS — Ht 72.0 in | Wt 226.0 lb

## 2021-01-20 DIAGNOSIS — Z1211 Encounter for screening for malignant neoplasm of colon: Secondary | ICD-10-CM

## 2021-01-20 MED ORDER — NA SULFATE-K SULFATE-MG SULF 17.5-3.13-1.6 GM/177ML PO SOLN
1.0000 | Freq: Once | ORAL | 0 refills | Status: AC
  Filled 2021-01-20: qty 354, 1d supply, fill #0

## 2021-01-20 NOTE — Progress Notes (Signed)
No egg or soy allergy known to patient  No issues with past sedation with any surgeries or procedures Patient denies ever being told they had issues or difficulty with intubation  No FH of Malignant Hyperthermia No diet pills per patient No home 02 use per patient  No blood thinners per patient  Pt denies issues with constipation  No A fib or A flutter  EMMI video to pt or via [facility] 19 guidelines implemented in PV today with Pt and RN  Pt is fully vaccinated  for Covid   Suprep ordered, [employer].  NO PA's for preps discussed with pt In PV today  Discussed with pt there will be an out-of-pocket cost for prep and that varies from $0 to 70 dollars   Due to the COVID-19 pandemic we are asking patients to follow certain guidelines.  Pt aware of COVID protocols and [facility] guidelines

## 2021-02-01 ENCOUNTER — Encounter: Payer: Self-pay | Admitting: Gastroenterology

## 2021-02-03 ENCOUNTER — Other Ambulatory Visit: Payer: Self-pay

## 2021-02-03 ENCOUNTER — Encounter: Payer: Self-pay | Admitting: Gastroenterology

## 2021-02-03 ENCOUNTER — Ambulatory Visit (AMBULATORY_SURGERY_CENTER): Payer: 59 | Admitting: Gastroenterology

## 2021-02-03 VITALS — BP 126/56 | HR 71 | Temp 97.8°F | Resp 11 | Ht 72.0 in | Wt 226.0 lb

## 2021-02-03 DIAGNOSIS — Z1211 Encounter for screening for malignant neoplasm of colon: Secondary | ICD-10-CM

## 2021-02-03 DIAGNOSIS — K635 Polyp of colon: Secondary | ICD-10-CM

## 2021-02-03 DIAGNOSIS — D123 Benign neoplasm of transverse colon: Secondary | ICD-10-CM

## 2021-02-03 MED ORDER — SODIUM CHLORIDE 0.9 % IV SOLN: 500.0000 mL | Freq: Once | INTRAVENOUS | Status: DC

## 2021-02-03 NOTE — Op Note (Signed)
[place] Patient Name: Joseph Spears Procedure Date: 02/03/2021 10:46 AM MRN: [id number] Endoscopist: Remo Lipps P. Havery [name] , MD Age: 50 Referring MD:  Date of Birth: 04-22-1971 Gender: Male Account #: [id number] Procedure:                Colonoscopy Indications:              Screening for colorectal malignant neoplasm, This                            is the patient's first colonoscopy Medicines:                Monitored Anesthesia Care Procedure:                Pre-Anesthesia Assessment:                           - Prior to the procedure, a History and Physical                            was performed, and patient medications and                            allergies were reviewed. The patient's tolerance of                            previous anesthesia was also reviewed. The risks                            and benefits of the procedure and the sedation                            options and risks were discussed with the patient.                            All questions were answered, and informed consent                            was obtained. Prior Anticoagulants: The patient has                            taken no previous anticoagulant or antiplatelet                            agents. ASA Grade Assessment: II - A patient with                            mild systemic disease. After reviewing the risks                            and benefits, the patient was deemed in                            satisfactory condition to undergo the procedure.  After obtaining informed consent, the colonoscope                            was passed under direct vision. Throughout the                            procedure, the patient's blood pressure, pulse, and                            oxygen saturations were monitored continuously. The                            Colonoscope was introduced through the anus and                            advanced to the the  cecum, identified by                            appendiceal orifice and ileocecal valve. The                            colonoscopy was performed without difficulty. The                            patient tolerated the procedure well. The quality                            of the bowel preparation was good. The ileocecal                            valve, appendiceal orifice, and rectum were                            photographed. Scope In: 10:52:29 AM Scope Out: 11:14:23 AM Scope Withdrawal Time: 0 hours 19 minutes 29 seconds  Total Procedure Duration: 0 hours 21 minutes 54 seconds  Findings:                 The perianal and digital rectal examinations were                            normal.                           A 4 mm polyp was found in the transverse colon. The                            polyp was sessile. The polyp was removed with a                            cold snare. Resection and retrieval were complete.                           A few small-mouthed diverticula were found in the  sigmoid colon.                           Internal hemorrhoids were found during                            retroflexion. The hemorrhoids were small.                           The exam was otherwise without abnormality. Complications:            No immediate complications. Estimated blood loss:                            Minimal. Estimated Blood Loss:     Estimated blood loss was minimal. Impression:               - One 4 mm polyp in the transverse colon, removed                            with a cold snare. Resected and retrieved.                           - Diverticulosis in the sigmoid colon.                           - Internal hemorrhoids.                           - The examination was otherwise normal. Recommendation:           - Patient has a contact number available for                            emergencies. The signs and symptoms of potential                             delayed complications were discussed with the                            patient. Return to normal activities tomorrow.                            Written discharge instructions were provided to the                            patient.                           - Resume previous diet.                           - Continue present medications.                           - Await pathology results. Remo Lipps P. Joseph Cilento, MD 02/03/2021 11:17:56 AM This report has been signed electronically.

## 2021-02-03 NOTE — Progress Notes (Signed)
Pt's states no medical or surgical changes since previsit or office visit.  Vs by Donnald Garre

## 2021-02-03 NOTE — Patient Instructions (Signed)
.  Handout on polyps, diverticulosis given.    YOU HAD AN ENDOSCOPIC PROCEDURE TODAY AT THE [facility]:   Refer to the procedure report that was given to you for any specific questions about what was found during the examination.  If the procedure report does not answer your questions, please call your gastroenterologist to clarify.  If you requested that your care partner not be given the details of your procedure findings, then the procedure report has been included in a sealed envelope for you to review at your convenience later.  YOU SHOULD EXPECT: Some feelings of bloating in the abdomen. Passage of more gas than usual.  Walking can help get rid of the air that was put into your GI tract during the procedure and reduce the bloating. If you had a lower endoscopy (such as a colonoscopy or flexible sigmoidoscopy) you may notice spotting of blood in your stool or on the toilet paper. If you underwent a bowel prep for your procedure, you may not have a normal bowel movement for a few days.  Please Note:  You might notice some irritation and congestion in your nose or some drainage.  This is from the oxygen used during your procedure.  There is no need for concern and it should clear up in a day or so.  SYMPTOMS TO REPORT IMMEDIATELY:   Following lower endoscopy (colonoscopy or flexible sigmoidoscopy):  Excessive amounts of blood in the stool  Significant tenderness or worsening of abdominal pains  Swelling of the abdomen that is new, acute  Fever of 100F or higher   For urgent or emergent issues, a gastroenterologist can be reached at any hour by calling [phone]. Do not use MyChart messaging for urgent concerns.    DIET:  We do recommend a small meal at first, but then you may proceed to your regular diet.  Drink plenty of fluids but you should avoid alcoholic beverages for 24 hours.  ACTIVITY:  You should plan to take it easy for the rest of today and you should NOT  DRIVE or use heavy machinery until tomorrow (because of the sedation medicines used during the test).    FOLLOW UP: Our staff will call the number listed on your records 48-72 hours following your procedure to check on you and address any questions or concerns that you may have regarding the information given to you following your procedure. If we do not reach you, we will leave a message.  We will attempt to reach you two times.  During this call, we will ask if you have developed any symptoms of COVID 19. If you develop any symptoms (ie: fever, flu-like symptoms, shortness of breath, cough etc.) before then, please call [phone].  If you test positive for Covid 19 in the 2 weeks post procedure, please call and report this information to us.    If any biopsies were taken you will be contacted by phone or by letter within the next 1-3 weeks.  Please call us at [phone] if you have not heard about the biopsies in 3 weeks.    SIGNATURES/CONFIDENTIALITY: You and/or your care partner have signed paperwork which will be entered into your electronic medical record.  These signatures attest to the fact that that the information above on your After Visit Summary has been reviewed and is understood.  Full responsibility of the confidentiality of this discharge information lies with you and/or your care-partner. 

## 2021-02-03 NOTE — Progress Notes (Signed)
A/ox3, pleased with MAC, report to RN 

## 2021-02-03 NOTE — Progress Notes (Signed)
Called to room to assist during endoscopic procedure.  Patient ID and intended procedure confirmed with present staff. Received instructions for my participation in the procedure from the performing physician.  

## 2021-02-07 ENCOUNTER — Telehealth: Payer: Self-pay

## 2021-02-07 NOTE — Telephone Encounter (Signed)
  Follow up Call-  Call back number 02/03/2021  Post procedure Call Back phone  # [phone]  Permission to leave phone message Yes  Some recent data might be hidden     Patient questions:  Do you have a fever, pain , or abdominal swelling? No. Pain Score  0 *  Have you tolerated food without any problems? Yes.    Have you been able to return to your normal activities? Yes.    Do you have any questions about your discharge instructions: Diet   No. Medications  No. Follow up visit  No.  Do you have questions or concerns about your Care? No.  Actions: * If pain score is 4 or above: No action needed, pain <4.  1. Have you developed a fever since your procedure? no  2.   Have you had an respiratory symptoms (SOB or cough) since your procedure? no  3.   Have you tested positive for COVID 19 since your procedure no  4.   Have you had any family members/close contacts diagnosed with the COVID 19 since your procedure?  no   If yes to any of these questions please route to Joylene John, RN and Joella Prince, RN

## 2021-02-08 ENCOUNTER — Other Ambulatory Visit (HOSPITAL_COMMUNITY): Payer: Self-pay

## 2021-02-08 MED FILL — Atorvastatin Calcium Tab 40 MG (Base Equivalent): ORAL | 90 days supply | Qty: 90 | Fill #0 | Status: AC

## 2021-02-11 ENCOUNTER — Other Ambulatory Visit (HOSPITAL_COMMUNITY): Payer: Self-pay

## 2021-02-14 ENCOUNTER — Other Ambulatory Visit (HOSPITAL_COMMUNITY): Payer: Self-pay

## 2021-02-15 ENCOUNTER — Other Ambulatory Visit (HOSPITAL_COMMUNITY): Payer: Self-pay

## 2021-05-11 ENCOUNTER — Other Ambulatory Visit (HOSPITAL_COMMUNITY): Payer: Self-pay

## 2021-05-11 ENCOUNTER — Ambulatory Visit (INDEPENDENT_AMBULATORY_CARE_PROVIDER_SITE_OTHER): Payer: 59 | Admitting: Family Medicine

## 2021-05-11 ENCOUNTER — Ambulatory Visit (INDEPENDENT_AMBULATORY_CARE_PROVIDER_SITE_OTHER): Payer: 59

## 2021-05-11 ENCOUNTER — Encounter: Payer: Self-pay | Admitting: Family Medicine

## 2021-05-11 ENCOUNTER — Other Ambulatory Visit: Payer: Self-pay

## 2021-05-11 VITALS — BP 140/74 | HR 81 | Temp 97.7°F | Ht 72.0 in | Wt 224.4 lb

## 2021-05-11 DIAGNOSIS — R0989 Other specified symptoms and signs involving the circulatory and respiratory systems: Secondary | ICD-10-CM | POA: Diagnosis not present

## 2021-05-11 DIAGNOSIS — E559 Vitamin D deficiency, unspecified: Secondary | ICD-10-CM | POA: Diagnosis not present

## 2021-05-11 DIAGNOSIS — Z125 Encounter for screening for malignant neoplasm of prostate: Secondary | ICD-10-CM

## 2021-05-11 DIAGNOSIS — E782 Mixed hyperlipidemia: Secondary | ICD-10-CM | POA: Diagnosis not present

## 2021-05-11 DIAGNOSIS — R0602 Shortness of breath: Secondary | ICD-10-CM | POA: Diagnosis not present

## 2021-05-11 DIAGNOSIS — R059 Cough, unspecified: Secondary | ICD-10-CM

## 2021-05-11 DIAGNOSIS — J449 Chronic obstructive pulmonary disease, unspecified: Secondary | ICD-10-CM | POA: Diagnosis not present

## 2021-05-11 DIAGNOSIS — Z0001 Encounter for general adult medical examination with abnormal findings: Secondary | ICD-10-CM | POA: Diagnosis not present

## 2021-05-11 DIAGNOSIS — Z0000 Encounter for general adult medical examination without abnormal findings: Secondary | ICD-10-CM

## 2021-05-11 MED ORDER — ATORVASTATIN CALCIUM 40 MG PO TABS
40.0000 mg | ORAL_TABLET | Freq: Every day | ORAL | 3 refills | Status: DC
  Filled 2021-05-11: qty 90, 90d supply, fill #0
  Filled 2021-08-22: qty 90, 90d supply, fill #1
  Filled 2021-11-30: qty 90, 90d supply, fill #2
  Filled 2022-03-13: qty 90, 90d supply, fill #3

## 2021-05-11 NOTE — Progress Notes (Signed)
CPE.   Subjective:  Patient ID: Joseph Spears, male    DOB: 04/04/1971  Age: 50 y.o. MRN: [id number]  CC: Annual Exam   HPI KALADIN NOSEWORTHY presents for CPE. No concerns today. Occasionally a little   Depression screen [facility] 2/9 05/11/2021 11/09/2020 04/26/2020  Decreased Interest 0 0 0  Down, Depressed, Hopeless 0 0 0  PHQ - 2 Score 0 0 0    History Ruxin has a past medical history of Allergy and Mixed hyperlipidemia (04/26/2020).   He has a past surgical history that includes Wisdom tooth extraction and Nasal septum surgery.   His family history includes Arthritis in his mother; Colon polyps in his mother; Diabetes in his father; Hyperlipidemia in his mother; Hypertension in his mother.He reports that he quit smoking about 23 years ago. His smoking use included cigarettes. He smoked an average of 1.5 packs per day. He has never used smokeless tobacco. He reports current alcohol use of about 3.0 standard drinks of alcohol per week. He reports that he does not use drugs.    ROS Review of Systems  Constitutional:  Negative for activity change, fatigue and unexpected weight change.  HENT:  Negative for congestion, ear pain, hearing loss, postnasal drip and trouble swallowing.   Eyes:  Negative for pain and visual disturbance.  Respiratory:  Negative for cough, chest tightness and shortness of breath.   Cardiovascular:  Negative for chest pain, palpitations and leg swelling.  Gastrointestinal:  Negative for abdominal distention, abdominal pain, blood in stool, constipation, diarrhea, nausea and vomiting.  Endocrine: Negative for cold intolerance, heat intolerance and polydipsia.  Genitourinary:  Negative for difficulty urinating, dysuria, flank pain, frequency and urgency.  Musculoskeletal:  Negative for arthralgias and joint swelling.  Skin:  Negative for color change, rash and wound.  Neurological:  Negative for dizziness, syncope, speech difficulty, weakness, light-headedness, numbness  and headaches.  Hematological:  Does not bruise/bleed easily.  Psychiatric/Behavioral:  Negative for confusion, decreased concentration, dysphoric mood and sleep disturbance. The patient is not nervous/anxious.    Objective:  BP 140/74   Pulse 81   Temp 97.7 F (36.5 C)   Ht 6' (1.829 m)   Wt 224 lb 6.4 oz (101.8 kg)   SpO2 99%   BMI 30.43 kg/m   BP Readings from Last 3 Encounters:  05/11/21 140/74  02/03/21 (!) 126/56  11/09/20 135/78    Wt Readings from Last 3 Encounters:  05/11/21 224 lb 6.4 oz (101.8 kg)  02/03/21 226 lb (102.5 kg)  01/20/21 226 lb (102.5 kg)     Physical Exam Constitutional:      Appearance: He is well-developed.  HENT:     Head: Normocephalic and atraumatic.  Eyes:     Pupils: Pupils are equal, round, and reactive to light.  Neck:     Thyroid: No thyromegaly.     Trachea: No tracheal deviation.  Cardiovascular:     Rate and Rhythm: Normal rate and regular rhythm.     Heart sounds: Normal heart sounds. No murmur heard.   No friction rub. No gallop.  Pulmonary:     Breath sounds: No wheezing or rales.     Comments: Right midlung field has diminished exchange compared to the left.l  Abdominal:     General: Bowel sounds are normal. There is no distension.     Palpations: Abdomen is soft. There is no mass.     Tenderness: There is no abdominal tenderness.     Hernia: There is  no hernia in the left inguinal area.  Genitourinary:    Penis: Normal.      Testes: Normal.  Musculoskeletal:        General: Normal range of motion.     Cervical back: Normal range of motion.  Lymphadenopathy:     Cervical: No cervical adenopathy.  Skin:    General: Skin is warm and dry.  Neurological:     Mental Status: He is alert and oriented to person, place, and time.      Assessment & Plan:   Montez was seen today for annual exam.  Diagnoses and all orders for this visit:  Well adult exam -     CMP14+EGFR -     CBC with Differential/Platelet -      Urinalysis -     PSA, total and free -     Lipid panel -     DG Chest 2 View; Future  Vitamin D deficiency  Mixed hyperlipidemia -     Lipid panel  Screening for prostate cancer -     PSA, total and free  Abnormal lung sounds -     DG Chest 2 View; Future  Other orders -     atorvastatin (LIPITOR) 40 MG tablet; Take 1 tablet (40 mg total) by mouth daily.      I am having Aaron Edelman T. Shearman maintain his Omega-3 Fatty Acids (FISH OIL CONCENTRATE PO), loratadine, multivitamin, glucosamine-chondroitin, TURMERIC CURCUMIN PO, and atorvastatin.  Allergies as of 05/11/2021   No Known Allergies      Medication List        Accurate as of May 11, 2021  2:52 PM. If you have any questions, ask your nurse or doctor.          atorvastatin 40 MG tablet Commonly known as: LIPITOR Take 1 tablet (40 mg total) by mouth daily. What changed: how much to take Changed by: Claretta Fraise, MD   FISH OIL CONCENTRATE PO Take by mouth.   glucosamine-chondroitin 500-400 MG tablet Take 1 tablet by mouth daily.   loratadine 10 MG tablet Commonly known as: CLARITIN Take 10 mg by mouth daily.   multivitamin tablet Take 1 tablet by mouth daily.   TURMERIC CURCUMIN PO Take by mouth daily.         Follow-up: No follow-ups on file.  Claretta Fraise, M.D.

## 2021-05-12 ENCOUNTER — Other Ambulatory Visit: Payer: Self-pay

## 2021-05-12 ENCOUNTER — Other Ambulatory Visit: Payer: 59

## 2021-05-12 DIAGNOSIS — Z0000 Encounter for general adult medical examination without abnormal findings: Secondary | ICD-10-CM | POA: Diagnosis not present

## 2021-05-12 LAB — CBC WITH DIFFERENTIAL/PLATELET
Basophils Absolute: 0 10*3/uL (ref 0.0–0.2)
Basos: 0 %
EOS (ABSOLUTE): 0 10*3/uL (ref 0.0–0.4)
Eos: 0 %
Hematocrit: 48.3 % (ref 37.5–51.0)
Hemoglobin: 16.4 g/dL (ref 13.0–17.7)
Immature Grans (Abs): 0 10*3/uL (ref 0.0–0.1)
Immature Granulocytes: 0 %
Lymphocytes Absolute: 2.1 10*3/uL (ref 0.7–3.1)
Lymphs: 28 %
MCH: 30.4 pg (ref 26.6–33.0)
MCHC: 34 g/dL (ref 31.5–35.7)
MCV: 90 fL (ref 79–97)
Monocytes Absolute: 0.6 10*3/uL (ref 0.1–0.9)
Monocytes: 8 %
Neutrophils Absolute: 4.7 10*3/uL (ref 1.4–7.0)
Neutrophils: 64 %
Platelets: 243 10*3/uL (ref 150–450)
RBC: 5.39 x10E6/uL (ref 4.14–5.80)
RDW: 13.2 % (ref 11.6–15.4)
WBC: 7.5 10*3/uL (ref 3.4–10.8)

## 2021-05-12 LAB — CMP14+EGFR
ALT: 51 IU/L — ABNORMAL HIGH (ref 0–44)
AST: 35 IU/L (ref 0–40)
Albumin/Globulin Ratio: 2.2 (ref 1.2–2.2)
Albumin: 5.1 g/dL — ABNORMAL HIGH (ref 3.8–4.9)
Alkaline Phosphatase: 93 IU/L (ref 44–121)
BUN/Creatinine Ratio: 19 (ref 9–20)
BUN: 19 mg/dL (ref 6–24)
Bilirubin Total: 1.1 mg/dL (ref 0.0–1.2)
CO2: 25 mmol/L (ref 20–29)
Calcium: 10.1 mg/dL (ref 8.7–10.2)
Chloride: 98 mmol/L (ref 96–106)
Creatinine, Ser: 0.98 mg/dL (ref 0.76–1.27)
Globulin, Total: 2.3 g/dL (ref 1.5–4.5)
Glucose: 82 mg/dL (ref 65–99)
Potassium: 4.6 mmol/L (ref 3.5–5.2)
Sodium: 139 mmol/L (ref 134–144)
Total Protein: 7.4 g/dL (ref 6.0–8.5)
eGFR: 93 mL/min/{1.73_m2} (ref >59)

## 2021-05-12 LAB — URINALYSIS
Bilirubin, UA: NEGATIVE
Glucose, UA: NEGATIVE
Ketones, UA: NEGATIVE
Leukocytes,UA: NEGATIVE
Nitrite, UA: NEGATIVE
Protein,UA: NEGATIVE
Specific Gravity, UA: 1.025 (ref 1.005–1.030)
Urobilinogen, Ur: 0.2 mg/dL (ref 0.2–1.0)
pH, UA: 6 (ref 5.0–7.5)

## 2021-05-12 LAB — LIPID PANEL
Chol/HDL Ratio: 3.4 ratio (ref 0.0–5.0)
Cholesterol, Total: 121 mg/dL (ref 100–199)
HDL: 36 mg/dL — ABNORMAL LOW (ref >39)
LDL Chol Calc (NIH): 44 mg/dL (ref 0–99)
Triglycerides: 263 mg/dL — ABNORMAL HIGH (ref 0–149)
VLDL Cholesterol Cal: 41 mg/dL — ABNORMAL HIGH (ref 5–40)

## 2021-05-12 LAB — PSA, TOTAL AND FREE
PSA, Free Pct: 45 %
PSA, Free: 0.36 ng/mL
Prostate Specific Ag, Serum: 0.8 ng/mL (ref 0.0–4.0)

## 2021-05-13 NOTE — Progress Notes (Signed)
Hello [place],  Your lab result is normal and/or stable.Some minor variations that are not significant are commonly marked abnormal, but do not represent any medical problem for you.  Best regards, Alessander Sikorski, M.D.

## 2021-08-22 ENCOUNTER — Other Ambulatory Visit (HOSPITAL_COMMUNITY): Payer: Self-pay

## 2021-11-22 ENCOUNTER — Other Ambulatory Visit (HOSPITAL_COMMUNITY): Payer: Self-pay

## 2021-11-30 ENCOUNTER — Other Ambulatory Visit (HOSPITAL_COMMUNITY): Payer: Self-pay

## 2021-12-05 ENCOUNTER — Other Ambulatory Visit (HOSPITAL_COMMUNITY): Payer: Self-pay

## 2022-03-13 ENCOUNTER — Other Ambulatory Visit (HOSPITAL_COMMUNITY): Payer: Self-pay

## 2022-03-23 IMAGING — DX DG CHEST 2V
2 series · 2 of 2 positions shown · non-contrast
Comparison: No prior.

CLINICAL DATA: Cough.  Shortness of breath.  COPD.

EXAM:
CHEST - 2 VIEW

[chest ap]
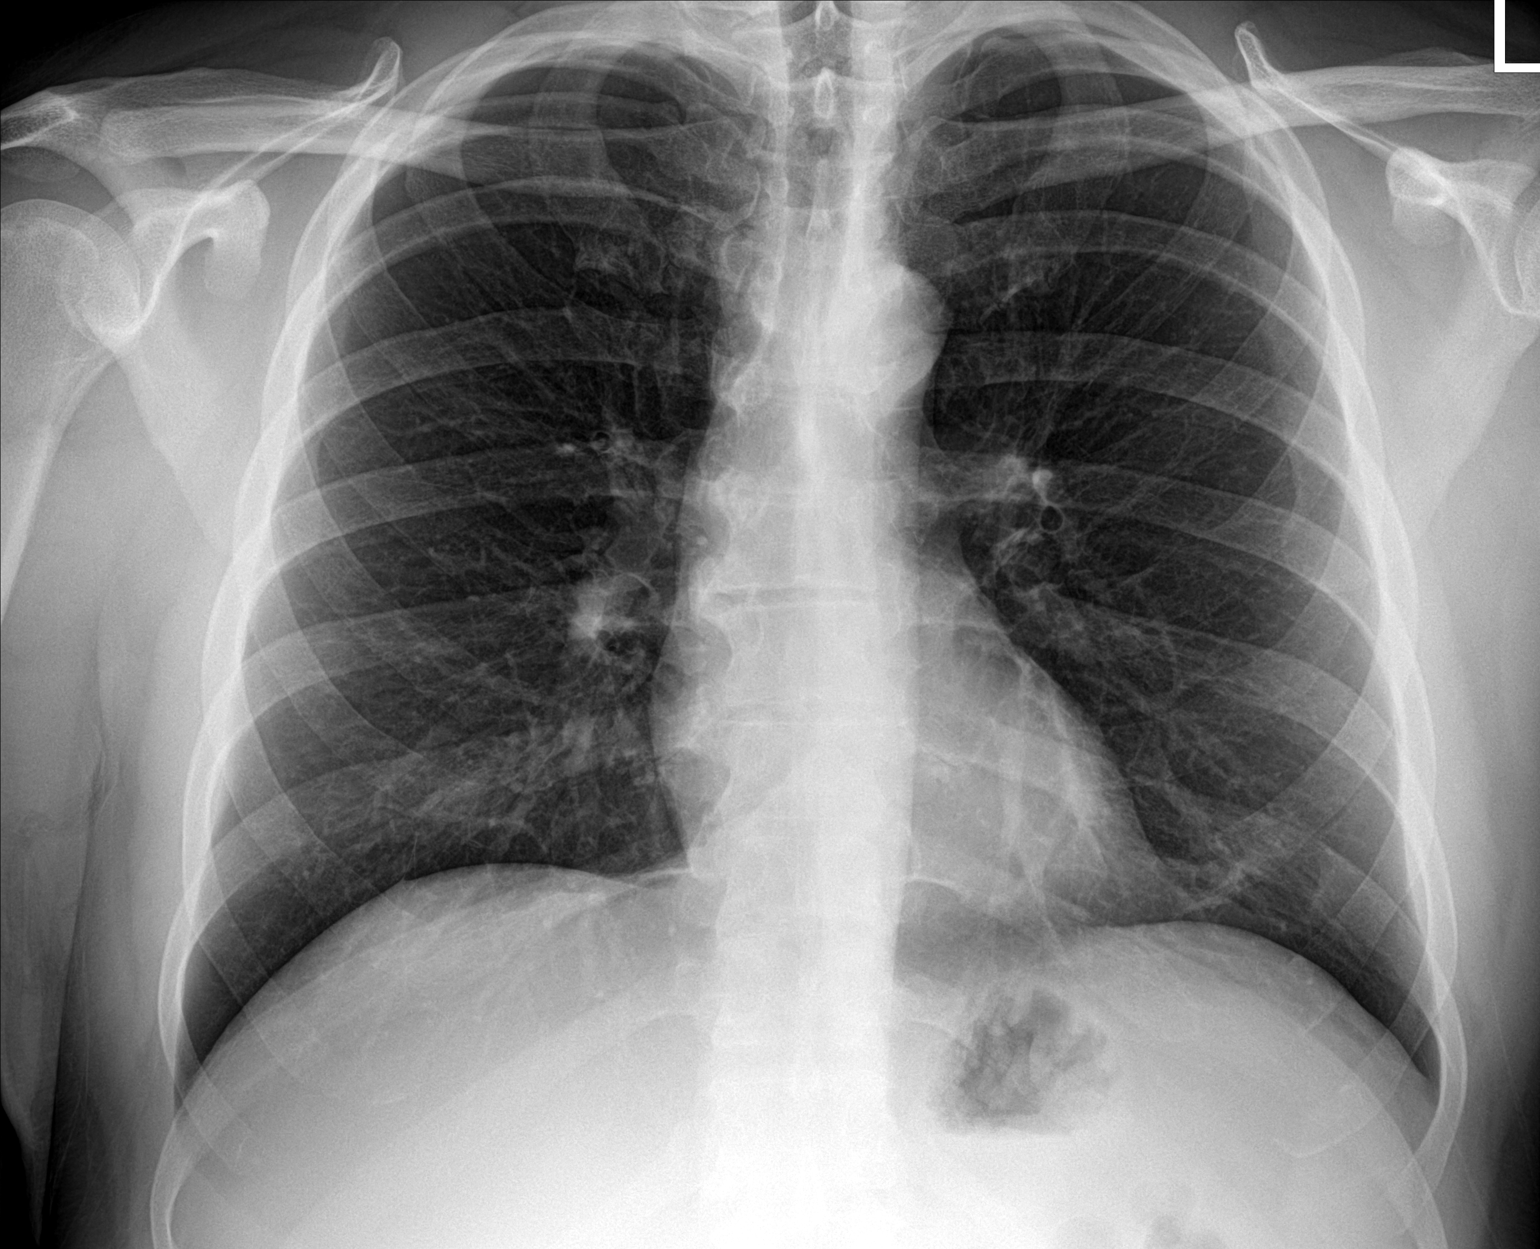

[chest lat]
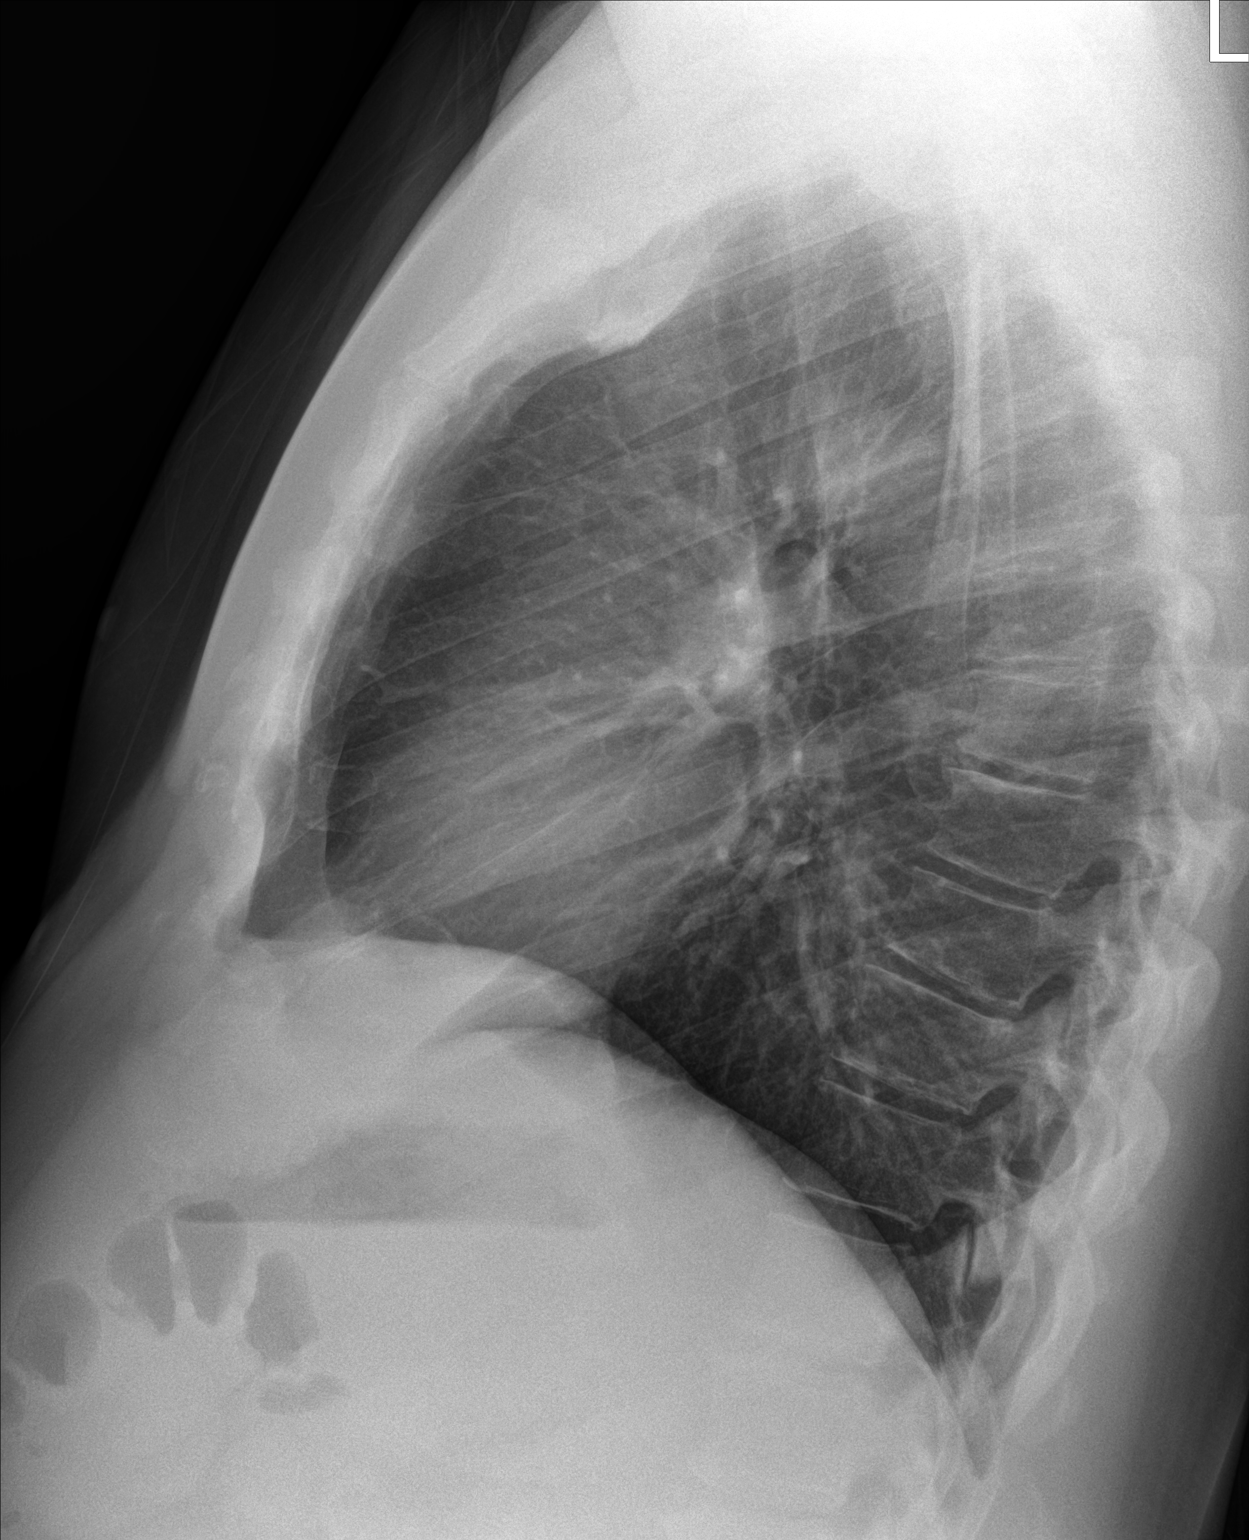

[2 of 2 positions shown; findings below may reference images not displayed]

FINDINGS: Mediastinum hilar structures normal. Mild peribronchial cuffing,
bronchitis cannot be excluded. No focal infiltrate. No pleural
effusion or pneumothorax. Degenerative change thoracic spine.
IMPRESSION: Mild peribronchial cuffing. Bronchitis cannot be excluded. No
pulmonary infiltrate noted.

## 2022-05-15 ENCOUNTER — Encounter: Payer: Self-pay | Admitting: Family Medicine

## 2022-05-15 ENCOUNTER — Ambulatory Visit (INDEPENDENT_AMBULATORY_CARE_PROVIDER_SITE_OTHER): Payer: 59 | Admitting: Family Medicine

## 2022-05-15 VITALS — BP 113/72 | HR 81 | Temp 99.1°F | Ht 72.0 in | Wt 227.2 lb

## 2022-05-15 DIAGNOSIS — Z0001 Encounter for general adult medical examination with abnormal findings: Secondary | ICD-10-CM | POA: Diagnosis not present

## 2022-05-15 DIAGNOSIS — Z23 Encounter for immunization: Secondary | ICD-10-CM | POA: Diagnosis not present

## 2022-05-15 DIAGNOSIS — E782 Mixed hyperlipidemia: Secondary | ICD-10-CM

## 2022-05-15 DIAGNOSIS — Z0000 Encounter for general adult medical examination without abnormal findings: Secondary | ICD-10-CM

## 2022-05-15 LAB — URINALYSIS
Bilirubin, UA: NEGATIVE
Glucose, UA: NEGATIVE
Ketones, UA: NEGATIVE
Leukocytes,UA: NEGATIVE
Nitrite, UA: NEGATIVE
Protein,UA: NEGATIVE
RBC, UA: NEGATIVE
Specific Gravity, UA: 1.025 (ref 1.005–1.030)
Urobilinogen, Ur: 0.2 mg/dL (ref 0.2–1.0)
pH, UA: 5 (ref 5.0–7.5)

## 2022-05-15 NOTE — Addendum Note (Signed)
Addended by: Baldomero Lamy B on: 05/15/2022 02:52 PM   Modules accepted: Orders

## 2022-05-15 NOTE — Progress Notes (Signed)
Subjective:  Patient ID: Joseph Spears, male    DOB: [birth year]  Age: 51 y.o. MRN: [id number]  CC: Annual Exam   HPI Joseph Spears presents for annual exam.  Shares concern for weight. Eats heavily of entrees  in for follow-up of elevated cholesterol. Doing well without complaints on current medication. Denies side effects of statin including myalgia and arthralgia and nausea. Currently no chest pain, shortness of breath or other cardiovascular related symptoms noted.      05/15/2022    2:01 PM 05/11/2021    2:01 PM 11/09/2020    8:08 AM  Depression screen PHQ 2/9  Decreased Interest 0 0 0  Down, Depressed, Hopeless 0 0 0  PHQ - 2 Score 0 0 0    History Joseph Spears has a past medical history of Allergy and Mixed hyperlipidemia (04/26/2020).   He has a past surgical history that includes Wisdom tooth extraction and Nasal septum surgery.   His family history includes Arthritis in his mother; Colon polyps in his mother; Diabetes in his father; Hyperlipidemia in his mother; Hypertension in his mother.He reports that he quit smoking about 24 years ago. His smoking use included cigarettes. He smoked an average of 1.5 packs per day. He has never used smokeless tobacco. He reports current alcohol use of about 3.0 standard drinks of alcohol per week. He reports that he does not use drugs.    ROS Review of Systems  Constitutional:  Negative for activity change, fatigue and unexpected weight change.  HENT:  Negative for congestion, ear pain, hearing loss, postnasal drip and trouble swallowing.   Eyes:  Negative for pain and visual disturbance.  Respiratory:  Negative for cough, chest tightness and shortness of breath.   Cardiovascular:  Negative for chest pain, palpitations and leg swelling.  Gastrointestinal:  Negative for abdominal distention, abdominal pain, blood in stool, constipation, diarrhea, nausea and vomiting.  Endocrine: Negative for cold intolerance, heat intolerance and polydipsia.   Genitourinary:  Negative for difficulty urinating, dysuria, flank pain, frequency and urgency.  Musculoskeletal:  Negative for arthralgias and joint swelling.  Skin:  Negative for color change, rash and wound.  Neurological:  Negative for dizziness, syncope, speech difficulty, weakness, light-headedness, numbness and headaches.  Hematological:  Does not bruise/bleed easily.  Psychiatric/Behavioral:  Negative for confusion, decreased concentration, dysphoric mood and sleep disturbance. The patient is not nervous/anxious.     Objective:  BP 113/72   Pulse 81   Temp 99.1 F (37.3 C)   Ht 6' (1.829 m)   Wt 227 lb 3.2 oz (103.1 kg)   SpO2 97%   BMI 30.81 kg/m   BP Readings from Last 3 Encounters:  05/15/22 113/72  05/11/21 140/74  02/03/21 (!) 126/56    Wt Readings from Last 3 Encounters:  05/15/22 227 lb 3.2 oz (103.1 kg)  05/11/21 224 lb 6.4 oz (101.8 kg)  02/03/21 226 lb (102.5 kg)     Physical Exam Constitutional:      Appearance: He is well-developed.  HENT:     Head: Normocephalic and atraumatic.  Eyes:     Pupils: Pupils are equal, round, and reactive to light.  Neck:     Thyroid: No thyromegaly.     Trachea: No tracheal deviation.  Cardiovascular:     Rate and Rhythm: Normal rate and regular rhythm.     Heart sounds: Normal heart sounds. No murmur heard.    No friction rub. No gallop.  Pulmonary:     Breath sounds:  Normal breath sounds. No wheezing or rales.  Abdominal:     General: Bowel sounds are normal. There is no distension.     Palpations: Abdomen is soft. There is no mass.     Tenderness: There is no abdominal tenderness.     Hernia: There is no hernia in the left inguinal area.  Genitourinary:    Penis: Normal.      Testes: Normal.  Musculoskeletal:        General: Normal range of motion.     Cervical back: Normal range of motion.  Lymphadenopathy:     Cervical: No cervical adenopathy.  Skin:    General: Skin is warm and dry.  Neurological:      Mental Status: He is alert and oriented to person, place, and time.       Assessment & Plan:   Joseph Spears was seen today for annual exam.  Diagnoses and all orders for this visit:  Well adult exam  Need for shingles vaccine -     Zoster Recombinant (Shingrix )  Mixed hyperlipidemia       I am having Joseph Spears maintain his Omega-3 Fatty Acids (FISH OIL CONCENTRATE PO), loratadine, multivitamin, glucosamine-chondroitin, TURMERIC CURCUMIN PO, and atorvastatin.  Allergies as of 05/15/2022   No Known Allergies      Medication List        Accurate as of May 15, 2022  2:40 PM. If you have any questions, ask your nurse or doctor.          atorvastatin 40 MG tablet Commonly known as: LIPITOR Take 1 tablet (40 mg total) by mouth daily.   FISH OIL CONCENTRATE PO Take by mouth.   glucosamine-chondroitin 500-400 MG tablet Take 1 tablet by mouth daily.   loratadine 10 MG tablet Commonly known as: CLARITIN Take 10 mg by mouth daily.   multivitamin tablet Take 1 tablet by mouth daily.   TURMERIC CURCUMIN PO Take by mouth daily.       Weight loss reviewed as priority  Follow-up: Return in about 1 year (around 05/16/2023).  Joseph Spears, M.D.

## 2022-05-16 LAB — CMP14+EGFR
ALT: 58 IU/L — ABNORMAL HIGH (ref 0–44)
AST: 34 IU/L (ref 0–40)
Albumin/Globulin Ratio: 2.2 (ref 1.2–2.2)
Albumin: 5 g/dL — ABNORMAL HIGH (ref 3.8–4.9)
Alkaline Phosphatase: 83 IU/L (ref 44–121)
BUN/Creatinine Ratio: 14 (ref 9–20)
BUN: 12 mg/dL (ref 6–24)
Bilirubin Total: 1 mg/dL (ref 0.0–1.2)
CO2: 23 mmol/L (ref 20–29)
Calcium: 9.8 mg/dL (ref 8.7–10.2)
Chloride: 100 mmol/L (ref 96–106)
Creatinine, Ser: 0.84 mg/dL (ref 0.76–1.27)
Globulin, Total: 2.3 g/dL (ref 1.5–4.5)
Glucose: 80 mg/dL (ref 70–99)
Potassium: 4.3 mmol/L (ref 3.5–5.2)
Sodium: 140 mmol/L (ref 134–144)
Total Protein: 7.3 g/dL (ref 6.0–8.5)
eGFR: 104 mL/min/{1.73_m2} (ref >59)

## 2022-05-16 LAB — CBC WITH DIFFERENTIAL/PLATELET
Basophils Absolute: 0 10*3/uL (ref 0.0–0.2)
Basos: 1 %
EOS (ABSOLUTE): 0 10*3/uL (ref 0.0–0.4)
Eos: 0 %
Hematocrit: 48.6 % (ref 37.5–51.0)
Hemoglobin: 16.3 g/dL (ref 13.0–17.7)
Immature Grans (Abs): 0 10*3/uL (ref 0.0–0.1)
Immature Granulocytes: 0 %
Lymphocytes Absolute: 1.9 10*3/uL (ref 0.7–3.1)
Lymphs: 24 %
MCH: 29.9 pg (ref 26.6–33.0)
MCHC: 33.5 g/dL (ref 31.5–35.7)
MCV: 89 fL (ref 79–97)
Monocytes Absolute: 0.5 10*3/uL (ref 0.1–0.9)
Monocytes: 7 %
Neutrophils Absolute: 5.3 10*3/uL (ref 1.4–7.0)
Neutrophils: 68 %
Platelets: 227 10*3/uL (ref 150–450)
RBC: 5.45 x10E6/uL (ref 4.14–5.80)
RDW: 13.2 % (ref 11.6–15.4)
WBC: 7.7 10*3/uL (ref 3.4–10.8)

## 2022-05-16 LAB — LIPID PANEL
Chol/HDL Ratio: 3.1 ratio (ref 0.0–5.0)
Cholesterol, Total: 129 mg/dL (ref 100–199)
HDL: 42 mg/dL (ref >39)
LDL Chol Calc (NIH): 46 mg/dL (ref 0–99)
Triglycerides: 264 mg/dL — ABNORMAL HIGH (ref 0–149)
VLDL Cholesterol Cal: 41 mg/dL — ABNORMAL HIGH (ref 5–40)

## 2022-05-16 LAB — PSA, TOTAL AND FREE
PSA, Free Pct: 44.3 %
PSA, Free: 0.31 ng/mL
Prostate Specific Ag, Serum: 0.7 ng/mL (ref 0.0–4.0)

## 2022-05-16 NOTE — Progress Notes (Signed)
Hello Nasier,  Your lab result is normal and/or stable.Some minor variations that are not significant are commonly marked abnormal, but do not represent any medical problem for you.  Best regards, Claretta Fraise, M.D.

## 2022-05-19 ENCOUNTER — Other Ambulatory Visit: Payer: Self-pay | Admitting: Family Medicine

## 2022-05-19 ENCOUNTER — Other Ambulatory Visit (HOSPITAL_COMMUNITY): Payer: Self-pay

## 2022-05-19 MED ORDER — ATORVASTATIN CALCIUM 40 MG PO TABS
40.0000 mg | ORAL_TABLET | Freq: Every day | ORAL | 3 refills | Status: DC
  Filled 2022-05-19: qty 90, 90d supply, fill #0
  Filled 2022-09-07: qty 90, 90d supply, fill #1
  Filled 2022-12-04: qty 90, 90d supply, fill #2
  Filled 2023-03-14: qty 90, 90d supply, fill #0
  Filled 2023-03-14: qty 90, 90d supply, fill #3

## 2022-05-24 ENCOUNTER — Other Ambulatory Visit (HOSPITAL_COMMUNITY): Payer: Self-pay

## 2022-05-25 ENCOUNTER — Other Ambulatory Visit (HOSPITAL_COMMUNITY): Payer: Self-pay

## 2022-08-15 ENCOUNTER — Ambulatory Visit (INDEPENDENT_AMBULATORY_CARE_PROVIDER_SITE_OTHER): Payer: 59 | Admitting: *Deleted

## 2022-08-15 DIAGNOSIS — Z23 Encounter for immunization: Secondary | ICD-10-CM

## 2022-09-07 ENCOUNTER — Other Ambulatory Visit (HOSPITAL_COMMUNITY): Payer: Self-pay

## 2022-11-10 ENCOUNTER — Other Ambulatory Visit (HOSPITAL_COMMUNITY): Payer: Self-pay

## 2022-12-04 ENCOUNTER — Other Ambulatory Visit (HOSPITAL_COMMUNITY): Payer: Self-pay

## 2022-12-07 ENCOUNTER — Other Ambulatory Visit (HOSPITAL_COMMUNITY): Payer: Self-pay

## 2023-03-14 ENCOUNTER — Other Ambulatory Visit: Payer: Self-pay

## 2023-03-14 ENCOUNTER — Other Ambulatory Visit (HOSPITAL_COMMUNITY): Payer: Self-pay

## 2023-05-29 ENCOUNTER — Ambulatory Visit (INDEPENDENT_AMBULATORY_CARE_PROVIDER_SITE_OTHER): Payer: 59 | Admitting: Family Medicine

## 2023-05-29 ENCOUNTER — Encounter: Payer: Self-pay | Admitting: Family Medicine

## 2023-05-29 VITALS — BP 115/71 | HR 81 | Temp 97.7°F | Ht 72.0 in | Wt 221.4 lb

## 2023-05-29 DIAGNOSIS — E782 Mixed hyperlipidemia: Secondary | ICD-10-CM

## 2023-05-29 DIAGNOSIS — Z125 Encounter for screening for malignant neoplasm of prostate: Secondary | ICD-10-CM | POA: Diagnosis not present

## 2023-05-29 DIAGNOSIS — Z0000 Encounter for general adult medical examination without abnormal findings: Secondary | ICD-10-CM | POA: Diagnosis not present

## 2023-05-29 DIAGNOSIS — Z0001 Encounter for general adult medical examination with abnormal findings: Secondary | ICD-10-CM

## 2023-05-29 DIAGNOSIS — R000 Tachycardia, unspecified: Secondary | ICD-10-CM | POA: Diagnosis not present

## 2023-05-29 LAB — URINALYSIS
Bilirubin, UA: NEGATIVE
Glucose, UA: NEGATIVE
Ketones, UA: NEGATIVE
Leukocytes,UA: NEGATIVE
Nitrite, UA: NEGATIVE
Protein,UA: NEGATIVE
Specific Gravity, UA: >1.03 — ABNORMAL HIGH (ref 1.005–1.030)
Urobilinogen, Ur: 0.2 mg/dL (ref 0.2–1.0)
pH, UA: 5.5 (ref 5.0–7.5)

## 2023-05-29 NOTE — Progress Notes (Signed)
Subjective:  Patient ID: BAXTON WITTBRODT, male    DOB: 1971/03/24  Age: 52 y.o. MRN: [id number]  CC: Annual Exam   HPI Joseph Spears presents for CPE.      05/29/2023    2:14 PM 05/29/2023    2:09 PM 05/15/2022    2:01 PM  Depression screen PHQ 2/9  Decreased Interest 0 0 0  Down, Depressed, Hopeless 0 0 0  PHQ - 2 Score 0 0 0  Altered sleeping 0    Tired, decreased energy 1    Change in appetite 1    Feeling bad or failure about yourself  0    Trouble concentrating 0    Moving slowly or fidgety/restless 0    Suicidal thoughts 0    PHQ-9 Score 2    Difficult doing work/chores Not difficult at all      History Joseph Spears has a past medical history of Allergy and Mixed hyperlipidemia (04/26/2020).   Joseph Spears has a past surgical history that includes Wisdom tooth extraction and Nasal septum surgery.   His family history includes Arthritis in his mother; Colon polyps in his mother; Diabetes in his father; Hyperlipidemia in his mother; Hypertension in his mother.Joseph Spears reports that Joseph Spears quit smoking about 25 years ago. His smoking use included cigarettes. Joseph Spears has never used smokeless tobacco. Joseph Spears reports current alcohol use of about 3.0 standard drinks of alcohol per week. Joseph Spears reports that Joseph Spears does not use drugs.    ROS Review of Systems  Constitutional:  Negative for activity change, fatigue and unexpected weight change.  HENT:  Negative for congestion, ear pain, hearing loss, postnasal drip and trouble swallowing.   Eyes:  Negative for pain and visual disturbance.  Respiratory:  Negative for cough, chest tightness and shortness of breath.   Cardiovascular:  Negative for chest pain, palpitations and leg swelling.  Gastrointestinal:  Negative for abdominal distention (gets gas with cucumbers, belches), abdominal pain, blood in stool, constipation, diarrhea, nausea and vomiting.  Endocrine: Negative for cold intolerance, heat intolerance and polydipsia.  Genitourinary:  Negative for difficulty  urinating, dysuria, flank pain, frequency and urgency.  Musculoskeletal:  Positive for arthralgias (left knee pain, chronic. Relieved by glucosamine &chondroitin). Negative for joint swelling.  Skin:  Negative for color change, rash and wound.  Neurological:  Negative for dizziness, syncope, speech difficulty, weakness, light-headedness, numbness and headaches.  Hematological:  Does not bruise/bleed easily.  Psychiatric/Behavioral:  Negative for confusion, decreased concentration, dysphoric mood and sleep disturbance. The patient is not nervous/anxious.     Objective:  BP 115/71   Pulse 81   Temp 97.7 F (36.5 C)   Ht 6' (1.829 m)   Wt 221 lb 6.4 oz (100.4 kg)   SpO2 98%   BMI 30.03 kg/m   BP Readings from Last 3 Encounters:  05/29/23 115/71  05/15/22 113/72  05/11/21 140/74    Wt Readings from Last 3 Encounters:  05/29/23 221 lb 6.4 oz (100.4 kg)  05/15/22 227 lb 3.2 oz (103.1 kg)  05/11/21 224 lb 6.4 oz (101.8 kg)     Physical Exam Constitutional:      Appearance: Joseph Spears is well-developed.  HENT:     Head: Normocephalic and atraumatic.  Eyes:     Pupils: Pupils are equal, round, and reactive to light.  Neck:     Thyroid: No thyromegaly.     Trachea: No tracheal deviation.  Cardiovascular:     Rate and Rhythm: Normal rate and regular rhythm.  Heart sounds: Normal heart sounds. No murmur heard.    No friction rub. No gallop.  Pulmonary:     Breath sounds: Normal breath sounds. No wheezing or rales.  Abdominal:     General: Bowel sounds are normal. There is no distension.     Palpations: Abdomen is soft. There is no mass.     Tenderness: There is no abdominal tenderness.     Hernia: There is no hernia in the left inguinal area.  Genitourinary:    Penis: Normal.      Testes: Normal.  Musculoskeletal:        General: Normal range of motion.     Cervical back: Normal range of motion.  Lymphadenopathy:     Cervical: No cervical adenopathy.  Skin:    General:  Skin is warm and dry.  Neurological:     Mental Status: Joseph Spears is alert and oriented to person, place, and time.       Assessment & Plan:   Joseph Spears was seen today for annual exam.  Diagnoses and all orders for this visit:  Well adult exam -     CBC with Differential/Platelet -     CMP14+EGFR -     Lipid panel -     Urinalysis  Mixed hyperlipidemia -     Lipid panel  Prostate cancer screening -     PSA, total and free  Tachycardia -     EKG 12-Lead       I have discontinued Joseph Spears's Omega-3 Fatty Acids (FISH OIL CONCENTRATE PO). I am also having him maintain his loratadine, multivitamin, glucosamine-chondroitin, TURMERIC CURCUMIN PO, and atorvastatin.  Allergies as of 05/29/2023   No Known Allergies      Medication List        Accurate as of May 29, 2023  2:39 PM. If you have any questions, ask your nurse or doctor.          STOP taking these medications    FISH OIL CONCENTRATE PO Stopped by: Nori Poland       TAKE these medications    atorvastatin 40 MG tablet Commonly known as: LIPITOR Take 1 tablet (40 mg total) by mouth daily.   glucosamine-chondroitin 500-400 MG tablet Take 1 tablet by mouth daily.   loratadine 10 MG tablet Commonly known as: CLARITIN Take 10 mg by mouth daily.   multivitamin tablet Take 1 tablet by mouth daily.   TURMERIC CURCUMIN PO Take by mouth daily.         Follow-up: No follow-ups on file.  Mechele Claude, M.D.

## 2023-05-30 LAB — CMP14+EGFR
ALT: 59 IU/L — ABNORMAL HIGH (ref 0–44)
AST: 37 IU/L (ref 0–40)
Albumin: 4.9 g/dL (ref 3.8–4.9)
Alkaline Phosphatase: 86 IU/L (ref 44–121)
BUN/Creatinine Ratio: 18 (ref 9–20)
BUN: 14 mg/dL (ref 6–24)
Bilirubin Total: 1.1 mg/dL (ref 0.0–1.2)
CO2: 23 mmol/L (ref 20–29)
Calcium: 9.9 mg/dL (ref 8.7–10.2)
Chloride: 102 mmol/L (ref 96–106)
Creatinine, Ser: 0.78 mg/dL (ref 0.76–1.27)
Globulin, Total: 2.4 g/dL (ref 1.5–4.5)
Glucose: 80 mg/dL (ref 70–99)
Potassium: 4.2 mmol/L (ref 3.5–5.2)
Sodium: 140 mmol/L (ref 134–144)
Total Protein: 7.3 g/dL (ref 6.0–8.5)
eGFR: 106 mL/min/{1.73_m2} (ref >59)

## 2023-05-30 LAB — CBC WITH DIFFERENTIAL/PLATELET
Basophils Absolute: 0 10*3/uL (ref 0.0–0.2)
Basos: 0 %
EOS (ABSOLUTE): 0 10*3/uL (ref 0.0–0.4)
Eos: 0 %
Hematocrit: 48.2 % (ref 37.5–51.0)
Hemoglobin: 16.5 g/dL (ref 13.0–17.7)
Immature Grans (Abs): 0 10*3/uL (ref 0.0–0.1)
Immature Granulocytes: 0 %
Lymphocytes Absolute: 1.6 10*3/uL (ref 0.7–3.1)
Lymphs: 23 %
MCH: 30.7 pg (ref 26.6–33.0)
MCHC: 34.2 g/dL (ref 31.5–35.7)
MCV: 90 fL (ref 79–97)
Monocytes Absolute: 0.5 10*3/uL (ref 0.1–0.9)
Monocytes: 7 %
Neutrophils Absolute: 4.9 10*3/uL (ref 1.4–7.0)
Neutrophils: 70 %
Platelets: 216 10*3/uL (ref 150–450)
RBC: 5.38 x10E6/uL (ref 4.14–5.80)
RDW: 12.8 % (ref 11.6–15.4)
WBC: 7 10*3/uL (ref 3.4–10.8)

## 2023-05-30 LAB — LIPID PANEL
Chol/HDL Ratio: 3 ratio (ref 0.0–5.0)
Cholesterol, Total: 120 mg/dL (ref 100–199)
HDL: 40 mg/dL (ref >39)
LDL Chol Calc (NIH): 39 mg/dL (ref 0–99)
Triglycerides: 267 mg/dL — ABNORMAL HIGH (ref 0–149)
VLDL Cholesterol Cal: 41 mg/dL — ABNORMAL HIGH (ref 5–40)

## 2023-05-30 LAB — PSA, TOTAL AND FREE
PSA, Free Pct: 51.4 %
PSA, Free: 0.36 ng/mL
Prostate Specific Ag, Serum: 0.7 ng/mL (ref 0.0–4.0)

## 2023-05-30 NOTE — Progress Notes (Signed)
Hello [place],  Your lab result is normal and/or stable.Some minor variations that are not significant are commonly marked abnormal, but do not represent any medical problem for you.  Best regards, Warren Stacks, M.D.

## 2023-06-04 ENCOUNTER — Other Ambulatory Visit: Payer: Self-pay | Admitting: Family Medicine

## 2023-06-04 ENCOUNTER — Other Ambulatory Visit: Payer: Self-pay

## 2023-06-04 ENCOUNTER — Other Ambulatory Visit (HOSPITAL_COMMUNITY): Payer: Self-pay

## 2023-06-04 MED ORDER — ATORVASTATIN CALCIUM 40 MG PO TABS
40.0000 mg | ORAL_TABLET | Freq: Every day | ORAL | 0 refills | Status: DC
  Filled 2023-06-04: qty 90, 90d supply, fill #0

## 2023-09-17 ENCOUNTER — Other Ambulatory Visit (HOSPITAL_COMMUNITY): Payer: Self-pay

## 2023-09-17 ENCOUNTER — Other Ambulatory Visit: Payer: Self-pay

## 2023-09-17 ENCOUNTER — Other Ambulatory Visit: Payer: Self-pay | Admitting: Family Medicine

## 2023-09-17 MED ORDER — ATORVASTATIN CALCIUM 40 MG PO TABS
40.0000 mg | ORAL_TABLET | Freq: Every day | ORAL | 0 refills | Status: DC
  Filled 2023-09-17: qty 90, 90d supply, fill #0

## 2023-12-28 ENCOUNTER — Other Ambulatory Visit: Payer: Self-pay | Admitting: Family Medicine

## 2023-12-28 ENCOUNTER — Other Ambulatory Visit (HOSPITAL_COMMUNITY): Payer: Self-pay

## 2023-12-28 ENCOUNTER — Other Ambulatory Visit (HOSPITAL_BASED_OUTPATIENT_CLINIC_OR_DEPARTMENT_OTHER): Payer: Self-pay

## 2023-12-28 MED ORDER — ATORVASTATIN CALCIUM 40 MG PO TABS
40.0000 mg | ORAL_TABLET | Freq: Every day | ORAL | 1 refills | Status: DC
  Filled 2023-12-28: qty 90, 90d supply, fill #0
  Filled 2024-03-18: qty 90, 90d supply, fill #1

## 2024-03-18 ENCOUNTER — Other Ambulatory Visit (HOSPITAL_COMMUNITY): Payer: Self-pay

## 2024-05-29 ENCOUNTER — Ambulatory Visit (INDEPENDENT_AMBULATORY_CARE_PROVIDER_SITE_OTHER): Payer: 59 | Admitting: Family Medicine

## 2024-05-29 ENCOUNTER — Other Ambulatory Visit (HOSPITAL_COMMUNITY): Payer: Self-pay

## 2024-05-29 ENCOUNTER — Encounter: Payer: Self-pay | Admitting: Family Medicine

## 2024-05-29 VITALS — BP 136/82 | HR 76 | Temp 97.7°F | Ht 72.0 in | Wt 206.6 lb

## 2024-05-29 DIAGNOSIS — N401 Enlarged prostate with lower urinary tract symptoms: Secondary | ICD-10-CM | POA: Diagnosis not present

## 2024-05-29 DIAGNOSIS — Z0001 Encounter for general adult medical examination with abnormal findings: Secondary | ICD-10-CM

## 2024-05-29 DIAGNOSIS — E782 Mixed hyperlipidemia: Secondary | ICD-10-CM

## 2024-05-29 DIAGNOSIS — R350 Frequency of micturition: Secondary | ICD-10-CM | POA: Diagnosis not present

## 2024-05-29 DIAGNOSIS — Z125 Encounter for screening for malignant neoplasm of prostate: Secondary | ICD-10-CM | POA: Diagnosis not present

## 2024-05-29 DIAGNOSIS — B351 Tinea unguium: Secondary | ICD-10-CM

## 2024-05-29 DIAGNOSIS — Z0000 Encounter for general adult medical examination without abnormal findings: Secondary | ICD-10-CM | POA: Diagnosis not present

## 2024-05-29 LAB — URINALYSIS
Bilirubin, UA: NEGATIVE
Glucose, UA: NEGATIVE
Leukocytes,UA: NEGATIVE
Nitrite, UA: NEGATIVE
Protein,UA: NEGATIVE
Specific Gravity, UA: 1.015 (ref 1.005–1.030)
Urobilinogen, Ur: 0.2 mg/dL (ref 0.2–1.0)
pH, UA: 7 (ref 5.0–7.5)

## 2024-05-29 MED ORDER — ATORVASTATIN CALCIUM 40 MG PO TABS
40.0000 mg | ORAL_TABLET | Freq: Every day | ORAL | 3 refills | Status: AC
  Filled 2024-05-29 – 2024-06-03 (×2): qty 90, 90d supply, fill #0
  Filled 2024-09-30: qty 90, 90d supply, fill #1

## 2024-05-30 LAB — CMP14+EGFR
ALT: 35 IU/L (ref 0–44)
AST: 26 IU/L (ref 0–40)
Albumin: 4.9 g/dL (ref 3.8–4.9)
Alkaline Phosphatase: 90 IU/L (ref 44–121)
BUN/Creatinine Ratio: 20 (ref 9–20)
BUN: 17 mg/dL (ref 6–24)
Bilirubin Total: 1.4 mg/dL — ABNORMAL HIGH (ref 0.0–1.2)
CO2: 23 mmol/L (ref 20–29)
Calcium: 9.6 mg/dL (ref 8.7–10.2)
Chloride: 99 mmol/L (ref 96–106)
Creatinine, Ser: 0.87 mg/dL (ref 0.76–1.27)
Globulin, Total: 2.6 g/dL (ref 1.5–4.5)
Glucose: 77 mg/dL (ref 70–99)
Potassium: 4.3 mmol/L (ref 3.5–5.2)
Sodium: 137 mmol/L (ref 134–144)
Total Protein: 7.5 g/dL (ref 6.0–8.5)
eGFR: 102 mL/min/1.73 (ref >59)

## 2024-05-30 LAB — CBC WITH DIFFERENTIAL/PLATELET
Basophils Absolute: 0 x10E3/uL (ref 0.0–0.2)
Basos: 0 %
EOS (ABSOLUTE): 0 x10E3/uL (ref 0.0–0.4)
Eos: 0 %
Hematocrit: 49.7 % (ref 37.5–51.0)
Hemoglobin: 16.3 g/dL (ref 13.0–17.7)
Immature Grans (Abs): 0 x10E3/uL (ref 0.0–0.1)
Immature Granulocytes: 0 %
Lymphocytes Absolute: 1.4 x10E3/uL (ref 0.7–3.1)
Lymphs: 22 %
MCH: 30.8 pg (ref 26.6–33.0)
MCHC: 32.8 g/dL (ref 31.5–35.7)
MCV: 94 fL (ref 79–97)
Monocytes Absolute: 0.5 x10E3/uL (ref 0.1–0.9)
Monocytes: 7 %
Neutrophils Absolute: 4.5 x10E3/uL (ref 1.4–7.0)
Neutrophils: 71 %
Platelets: 194 x10E3/uL (ref 150–450)
RBC: 5.29 x10E6/uL (ref 4.14–5.80)
RDW: 12.4 % (ref 11.6–15.4)
WBC: 6.4 x10E3/uL (ref 3.4–10.8)

## 2024-05-30 LAB — LIPID PANEL
Chol/HDL Ratio: 2.4 ratio (ref 0.0–5.0)
Cholesterol, Total: 130 mg/dL (ref 100–199)
HDL: 55 mg/dL (ref >39)
LDL Chol Calc (NIH): 52 mg/dL (ref 0–99)
Triglycerides: 132 mg/dL (ref 0–149)
VLDL Cholesterol Cal: 23 mg/dL (ref 5–40)

## 2024-05-30 LAB — PSA, TOTAL AND FREE
PSA, Free Pct: 39.1 %
PSA, Free: 0.43 ng/mL
Prostate Specific Ag, Serum: 1.1 ng/mL (ref 0.0–4.0)

## 2024-06-01 NOTE — Progress Notes (Signed)
 Subjective:  Patient ID: Joseph Spears, male    DOB: August 22, 1971  Age: 53 y.o. MRN: [id number]  CC: Annual Exam   HPI  History of Present Illness  Here for Annual Exam         05/29/2024    2:00 PM 05/29/2023    2:14 PM 05/29/2023    2:09 PM  Depression screen PHQ 2/9  Decreased Interest 0 0 0  Down, Depressed, Hopeless 0 0 0  PHQ - 2 Score 0 0 0  Altered sleeping  0   Tired, decreased energy  1   Change in appetite  1   Feeling bad or failure about yourself   0   Trouble concentrating  0   Moving slowly or fidgety/restless  0   Suicidal thoughts  0   PHQ-9 Score  2   Difficult doing work/chores  Not difficult at all     History Joseph Spears has a past medical history of Allergy and Mixed hyperlipidemia (04/26/2020).   He has a past surgical history that includes Wisdom tooth extraction and Nasal septum surgery.   His family history includes Arthritis in his mother; Colon polyps in his mother; Diabetes in his father; Hyperlipidemia in his mother; Hypertension in his mother.He reports that he quit smoking about 26 years ago. His smoking use included cigarettes. He has never used smokeless tobacco. He reports current alcohol use of about 3.0 standard drinks of alcohol per week. He reports that he does not use drugs.    ROS Review of Systems  Constitutional:  Negative for activity change, fatigue and unexpected weight change.  HENT:  Negative for congestion, ear pain, hearing loss, postnasal drip and trouble swallowing.   Eyes:  Negative for pain and visual disturbance.  Respiratory:  Negative for cough, chest tightness and shortness of breath.   Cardiovascular:  Negative for chest pain, palpitations and leg swelling.  Gastrointestinal:  Negative for abdominal distention, abdominal pain, blood in stool, constipation, diarrhea, nausea and vomiting.  Endocrine: Negative for cold intolerance, heat intolerance and polydipsia.  Genitourinary:  Positive for frequency. Negative for  difficulty urinating, dysuria, flank pain and urgency. Penile discharge: with nocturia. Musculoskeletal:  Negative for arthralgias and joint swelling.  Skin:  Negative for color change, rash and wound.  Neurological:  Negative for dizziness, syncope, speech difficulty, weakness, light-headedness, numbness and headaches.  Hematological:  Does not bruise/bleed easily.  Psychiatric/Behavioral:  Negative for confusion, decreased concentration, dysphoric mood and sleep disturbance. The patient is not nervous/anxious.     Objective:  BP 136/82   Pulse 76   Temp 97.7 F (36.5 C)   Ht 6' (1.829 m)   Wt 206 lb 9.6 oz (93.7 kg)   SpO2 98%   BMI 28.02 kg/m   BP Readings from Last 3 Encounters:  05/29/24 136/82  05/29/23 115/71  05/15/22 113/72    Wt Readings from Last 3 Encounters:  05/29/24 206 lb 9.6 oz (93.7 kg)  05/29/23 221 lb 6.4 oz (100.4 kg)  05/15/22 227 lb 3.2 oz (103.1 kg)     Physical Exam Constitutional:      Appearance: He is well-developed.  HENT:     Head: Normocephalic and atraumatic.  Eyes:     Pupils: Pupils are equal, round, and reactive to light.  Neck:     Thyroid: No thyromegaly.     Trachea: No tracheal deviation.  Cardiovascular:     Rate and Rhythm: Normal rate and regular rhythm.     Heart  sounds: Normal heart sounds. No murmur heard.    No friction rub. No gallop.  Pulmonary:     Breath sounds: Normal breath sounds. No wheezing or rales.  Abdominal:     General: Bowel sounds are normal. There is no distension.     Palpations: Abdomen is soft. There is no mass.     Tenderness: There is no abdominal tenderness.     Hernia: There is no hernia in the left inguinal area.  Genitourinary:    Penis: Normal.      Testes: Normal.  Musculoskeletal:        General: Normal range of motion.     Cervical back: Normal range of motion. Rigidity present.  Lymphadenopathy:     Cervical: No cervical adenopathy.  Skin:    General: Skin is warm and dry.      Findings: Lesion (mild onycholysis of toenails R&L 1&2) present.  Neurological:     Mental Status: He is alert and oriented to person, place, and time.      Assessment & Plan:  Well adult exam -     Lipid panel -     CBC with Differential/Platelet -     CMP14+EGFR -     PSA, total and free -     Urinalysis  Mixed hyperlipidemia -     CBC with Differential/Platelet -     CMP14+EGFR  Prostate cancer screening -     PSA, total and free  Benign prostatic hyperplasia with urinary frequency  Onychomycosis  Other orders -     Atorvastatin  Calcium ; Take 1 tablet (40 mg total) by mouth daily.  Dispense: 90 tablet; Refill: 3   Return in about 1 year (around 05/29/2025) for [employer] physical.  Butler Der, MD                   Assessment & Plan

## 2024-06-02 ENCOUNTER — Ambulatory Visit: Payer: Self-pay | Admitting: Family Medicine

## 2024-06-02 NOTE — Progress Notes (Signed)
Hello Artyom,  Your lab result is normal and/or stable.Some minor variations that are not significant are commonly marked abnormal, but do not represent any medical problem for you.  Best regards, Trestan Vahle, M.D.

## 2024-06-03 ENCOUNTER — Other Ambulatory Visit (HOSPITAL_COMMUNITY): Payer: Self-pay

## 2024-09-30 ENCOUNTER — Other Ambulatory Visit (HOSPITAL_COMMUNITY): Payer: Self-pay

## 2024-09-30 ENCOUNTER — Other Ambulatory Visit: Payer: Self-pay

## 2025-06-01 ENCOUNTER — Encounter: Payer: Self-pay | Admitting: Family Medicine
